# Patient Record
Sex: Female | Born: 1995
Health system: Southern US, Community
[De-identification: ages and names within clinical notes are randomized; demographics above are authoritative.]

## PROBLEM LIST (undated history)

## (undated) DIAGNOSIS — J3089 Other allergic rhinitis: Secondary | ICD-10-CM

## (undated) DIAGNOSIS — J45909 Unspecified asthma, uncomplicated: Secondary | ICD-10-CM

## (undated) DIAGNOSIS — G43909 Migraine, unspecified, not intractable, without status migrainosus: Secondary | ICD-10-CM

## (undated) HISTORY — DX: Other allergic rhinitis: J30.89

## (undated) HISTORY — PX: NECK MASS EXCISION: SHX2079

## (undated) HISTORY — DX: Migraine, unspecified, not intractable, without status migrainosus: G43.909

---
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
## 2019-02-25 ENCOUNTER — Other Ambulatory Visit: Payer: Self-pay

## 2019-02-25 ENCOUNTER — Emergency Department
Admission: EM | Admit: 2019-02-25 | Discharge: 2019-02-25 | Disposition: A | Discharge status: Home / Self Care | Payer: Self-pay | Attending: Emergency Medicine | Admitting: Emergency Medicine

## 2019-02-25 DIAGNOSIS — J45909 Unspecified asthma, uncomplicated: Secondary | ICD-10-CM | POA: Insufficient documentation

## 2019-02-25 DIAGNOSIS — L03011 Cellulitis of right finger: Secondary | ICD-10-CM | POA: Insufficient documentation

## 2019-02-25 DIAGNOSIS — M79644 Pain in right finger(s): Secondary | ICD-10-CM | POA: Insufficient documentation

## 2019-02-25 HISTORY — DX: Unspecified asthma, uncomplicated: J45.909

## 2019-02-25 MED ORDER — CEPHALEXIN 500 MG PO CAPS: 500.0000 mg | ORAL_CAPSULE | Freq: Three times a day (TID) | ORAL | 0 refills | Status: DC

## 2019-02-25 MED ORDER — CEPHALEXIN 500 MG PO CAPS
500.0000 mg | ORAL_CAPSULE | Freq: Once | ORAL | Status: AC
  Administered 2019-02-25: 500 mg via ORAL
  Filled 2019-02-25: qty 1

## 2019-02-25 NOTE — ED Provider Notes (Signed)
[facility] Emergency Department Provider Note ____________________________________________  Time seen: [id number]  I have reviewed the triage vital signs and the nursing notes.  HISTORY  Chief Complaint  Hand Pain  HPI Courtney Galvan is a 23 y.o. female presents herself to the ED with a 3-day complaint of swelling and pain to the fingertip of the right middle finger.  Patient denies any known injury, trauma, or accident.  She does admit to trimming her cuticles weekly, and noted initially pain to the lateral aspect of the cuticle on the right middle finger.  She denied any hangnail, bleeding, or purulent drainage.  Since that time she has had pain that seemed to progress from the medial aspect of the cuticle to the fat pad at the fingertip.  She presents today with tightness to the fingertip but reports normal range and sensation.  She denies any interim fevers, chills, or sweats.   Past Medical History:  Diagnosis Date  . Asthma     There are no active problems to display for this patient.   History reviewed. No pertinent surgical history.  Prior to Admission medications   Medication Sig Start Date End Date Taking? Authorizing Provider  cephALEXin (KEFLEX) 500 MG capsule Take 1 capsule (500 mg total) by mouth 3 (three) times daily. 02/25/19   Orin Eberwein, Dannielle Karvonen, PA-C    Allergies Patient has no known allergies.  No family history on file.  Social History Social History   Tobacco Use  . Smoking status: Never Smoker  . Smokeless tobacco: Never Used  Substance Use Topics  . Alcohol use: Not Currently  . Drug use: Not Currently    Review of Systems  Constitutional: Negative for fever. Cardiovascular: Negative for chest pain. Respiratory: Negative for shortness of breath. Musculoskeletal: Negative for back pain. Right middle finger pain  Skin: Negative for rash. Neurological: Negative for headaches, focal weakness or  numbness. ____________________________________________  PHYSICAL EXAM:  VITAL SIGNS: ED Triage Vitals  Enc Vitals Group     BP 02/25/19 1359 118/70     Pulse Rate 02/25/19 1359 64     Resp 02/25/19 1359 16     Temp 02/25/19 1359 98.2 F (36.8 C)     Temp Source 02/25/19 1359 Oral     SpO2 02/25/19 1359 98 %     Weight 02/25/19 1359 280 lb (127 kg)     Height 02/25/19 1359 5\' 7"  (1.702 m)     Head Circumference --      Peak Flow --      Pain Score 02/25/19 1356 6     Pain Loc --      Pain Edu? --      Excl. in [place]? --     Constitutional: Alert and oriented. Well appearing and in no distress. Head: Normocephalic and atraumatic. Eyes: Conjunctivae are normal. Normal extraocular movements Cardiovascular: Normal rate, regular rhythm. Normal distal pulses. Respiratory: Normal respiratory effort. No wheezes/rales/rhonchi. Gastrointestinal: Soft and nontender. No distention. Musculoskeletal: Right middle finger with subtle swelling to the medial nail cuticle. No obvious, gross purulent collection noted. There is tenderness to palpation to the lateral cuticle as well as some firmness to the fat pad.  Hepatospleno without any obvious pus collection noted.  Nontender with normal range of motion in all extremities.  Neurologic:  Normal gait without ataxia. Normal speech and language. No gross focal neurologic deficits are appreciated. Skin:  Skin is warm, dry and intact. No rash noted. ____________________________________________  PROCEDURES  Keflex 500 mg PO Procedures ____________________________________________  INITIAL IMPRESSION / ASSESSMENT AND PLAN / ED COURSE  Patient with ED evaluation of paronychia to the right middle finger.  She has some extension of tenderness and swelling to the finger pad at this time but no significant felon is appreciated.  Patient will be started empirically on antibiotics and encouraged to monitor closely for increased pain and swelling patient  return to the ED immediately for further management including I&D procedure.  Courtney Galvan was evaluated in Emergency Department on 02/25/2019 for the symptoms described in the history of present illness. She was evaluated in the context of the global COVID-19 pandemic, which necessitated consideration that the patient might be at risk for infection with the SARS-CoV-2 virus that causes COVID-19. Institutional protocols and algorithms that pertain to the evaluation of patients at risk for COVID-19 are in a state of rapid change based on information released by regulatory bodies including the CDC and federal and state organizations. These policies and algorithms were followed during the patient's care in the ED. ____________________________________________  FINAL CLINICAL IMPRESSION(S) / ED DIAGNOSES  Final diagnoses:  Paronychia of right middle finger      Marx Doig, Charlesetta Ivory, PA-C 02/25/19 1701    Chesley Noon, MD 02/25/19 1810

## 2019-02-25 NOTE — Discharge Instructions (Signed)
Your exam is consistent with a cuticle infection. Take the antibiotic as directed. Soak the finger in warm epsom salt soaks to promote healing. You should return to the ED for any drainable infection.

## 2019-02-25 NOTE — ED Triage Notes (Signed)
Pt c/o pain and swelling to the right middle finger for the past 3 days, denies injury

## 2019-03-04 ENCOUNTER — Encounter: Payer: Self-pay | Admitting: Emergency Medicine

## 2019-03-04 ENCOUNTER — Emergency Department
Admission: EM | Admit: 2019-03-04 | Discharge: 2019-03-04 | Disposition: A | Discharge status: Home / Self Care | Payer: Medicaid Other | Attending: Emergency Medicine | Admitting: Emergency Medicine

## 2019-03-04 ENCOUNTER — Other Ambulatory Visit: Payer: Self-pay

## 2019-03-04 DIAGNOSIS — L03011 Cellulitis of right finger: Secondary | ICD-10-CM

## 2019-03-04 DIAGNOSIS — J45909 Unspecified asthma, uncomplicated: Secondary | ICD-10-CM | POA: Insufficient documentation

## 2019-03-04 MED ORDER — CEPHALEXIN 500 MG PO CAPS: 500.0000 mg | ORAL_CAPSULE | Freq: Three times a day (TID) | ORAL | 0 refills | Status: DC

## 2019-03-04 MED ORDER — HYDROCODONE-ACETAMINOPHEN 5-325 MG PO TABS: 1.0000 | ORAL_TABLET | Freq: Three times a day (TID) | ORAL | 0 refills | Status: DC | PRN

## 2019-03-04 MED ORDER — VANCOMYCIN HCL IN DEXTROSE 1-5 GM/200ML-% IV SOLN
1000.0000 mg | Freq: Once | INTRAVENOUS | Status: AC
  Administered 2019-03-04: 1000 mg via INTRAVENOUS
  Filled 2019-03-04: qty 200

## 2019-03-04 MED ORDER — SULFAMETHOXAZOLE-TRIMETHOPRIM 800-160 MG PO TABS: 1.0000 | ORAL_TABLET | Freq: Two times a day (BID) | ORAL | 0 refills | Status: DC

## 2019-03-04 MED ORDER — LIDOCAINE HCL (PF) 1 % IJ SOLN
10.0000 mL | Freq: Once | INTRAMUSCULAR | Status: AC
  Administered 2019-03-04: 10 mL
  Filled 2019-03-04: qty 10

## 2019-03-04 NOTE — Discharge Instructions (Signed)
Return to the emergency department for packing removal and also for reevaluation of your finger.  Begin taking 2 antibiotics.  A prescription for the same medication that you are taking and a another medication combined should finish the course of your antibiotics.  If your finger worsens return to the emergency department before the 2 to 3 days.  A narcotic was prescribed also to be taken at night so that she can get some rest.

## 2019-03-04 NOTE — ED Provider Notes (Signed)
[facility] Emergency Department Provider Note   ____________________________________________   First MD Initiated Contact with Patient 03/04/19 [phone]     (approximate)  I have reviewed the triage vital signs and the nursing notes.   HISTORY  Chief Complaint Hand Pain   HPI Courtney Galvan is a 23 y.o. female presents to the ED for follow-up of her right third finger.  Patient was seen on 02/25/2019 for a paronychia.  Patient has been taking Keflex and soaking her finger.  Patient states she does well during the day but if she bumps it during the night it wakes her up.  Currently she rates her pain as 4 out of 10.       Past Medical History:  Diagnosis Date  . Asthma     There are no active problems to display for this patient.   History reviewed. No pertinent surgical history.  Prior to Admission medications   Medication Sig Start Date End Date Taking? Authorizing Provider  cephALEXin (KEFLEX) 500 MG capsule Take 1 capsule (500 mg total) by mouth 3 (three) times daily. 03/04/19   Johnn Hai, PA-C  HYDROcodone-acetaminophen (NORCO/VICODIN) 5-325 MG tablet Take 1 tablet by mouth every 8 (eight) hours as needed for moderate pain. 03/04/19   Johnn Hai, PA-C  sulfamethoxazole-trimethoprim (BACTRIM DS) 800-160 MG tablet Take 1 tablet by mouth 2 (two) times daily. 03/04/19   Johnn Hai, PA-C    Allergies Patient has no known allergies.  No family history on file.  Social History Social History   Tobacco Use  . Smoking status: Never Smoker  . Smokeless tobacco: Never Used  Substance Use Topics  . Alcohol use: Not Currently  . Drug use: Not Currently    Review of Systems Constitutional: No fever/chills Cardiovascular: Denies chest pain. Respiratory: Denies shortness of breath. Gastrointestinal:  No nausea, no vomiting.  Musculoskeletal: Pain right third finger. Skin: Question infection right third finger  Neurological: Negative for headaches, focal weakness or numbness. ____________________________________________   PHYSICAL EXAM:  VITAL SIGNS: ED Triage Vitals  Enc Vitals Group     BP 03/04/19 0737 (!) 128/49     Pulse Rate 03/04/19 0737 71     Resp 03/04/19 0737 18     Temp 03/04/19 0737 99 F (37.2 C)     Temp Source 03/04/19 0737 Oral     SpO2 03/04/19 0737 99 %     Weight 03/04/19 0731 279 lb 15.8 oz (127 kg)     Height 03/04/19 0731 5\' 7"  (1.702 m)     Head Circumference --      Peak Flow --      Pain Score 03/04/19 0731 4     Pain Loc --      Pain Edu? --      Excl. in [place]? --     Constitutional: Alert and oriented. Well appearing and in no acute distress. Eyes: Conjunctivae are normal. Head: Atraumatic. Neck: No stridor.   Cardiovascular: Normal rate, regular rhythm. Grossly normal heart sounds.  Good peripheral circulation. Respiratory: Normal respiratory effort.  No retractions. Lungs CTAB. Musculoskeletal: Moves right digits with minimal restriction.  Patient is able to flex and extend her third digit distally.  Capillary refill is less than 3 seconds.  There is moderate tenderness with a fluctuant area on the lateral aspect at the base of the nail. Neurologic:  Normal speech and language. No gross focal neurologic deficits are appreciated. No gait instability. Skin:  Skin is warm, dry and intact.  Psychiatric: Mood and affect are normal. Speech and behavior are normal.  ____________________________________________   LABS (all labs ordered are listed, but only abnormal results are displayed)  Labs Reviewed - No data to display ____________________________________________  PROCEDURES  Procedure(s) performed (including Critical Care):  Marland KitchenMarland KitchenIncision and Drainage  Date/Time: 03/04/2019 8:30 AM Performed by: Tommi Rumps, PA-C Authorized by: Tommi Rumps, PA-C   Consent:    Consent obtained:  Verbal   Consent given by:  Patient   Risks discussed:   Pain Location:    Type:  Abscess   Location:  Upper extremity   Upper extremity location:  Finger   Finger location:  R long finger Pre-procedure details:    Skin preparation:  Antiseptic wash Anesthesia (see MAR for exact dosages):    Anesthesia method:  Nerve block   Block anesthetic:  Lidocaine 1% w/o epi   Block injection procedure:  Anatomic landmarks identified, introduced needle, incremental injection and negative aspiration for blood   Block outcome:  Anesthesia achieved Procedure type:    Complexity:  Simple Procedure details:    Needle aspiration: no     Incision types:  Single straight   Incision depth:  Dermal   Scalpel blade:  11   Wound management:  Probed and deloculated and irrigated with saline   Drainage:  Purulent   Drainage amount:  Moderate   Wound treatment:  Drain placed   Packing materials:  1/4 in iodoform gauze Post-procedure details:    Patient tolerance of procedure:  Tolerated well, no immediate complications     ____________________________________________   INITIAL IMPRESSION / ASSESSMENT AND PLAN / ED COURSE  As part of my medical decision making, I reviewed the following data within the electronic MEDICAL RECORD NUMBER Notes from prior ED visits and [place] Controlled Substance Database  23 year old female presents to the ED with complaint of right third digit pain.  Patient has been taking Keflex 500 mg and soaking her finger in warm water.  She denies any drainage from the area.  I&D was discussed with the patient and she tolerated it well.  There was a moderate amount of purulent drainage removed.  Iodoform gauze was placed to keep the area draining.  Patient also got vancomycin 1 g IV while in the ED.  Patient is to return to the ED in 2 days to have this removed.  She was discharged with a prescription for Bactrim DS twice daily for 10 days and continued Keflex.  Patient was made aware that she needs to return sooner if any worsening of her symptoms  and also if this is not improving that most likely she will be admitted.  ____________________________________________   FINAL CLINICAL IMPRESSION(S) / ED DIAGNOSES  Final diagnoses:  Paronychia of right middle finger     ED Discharge Orders         Ordered    cephALEXin (KEFLEX) 500 MG capsule  3 times daily     03/04/19 1055    sulfamethoxazole-trimethoprim (BACTRIM DS) 800-160 MG tablet  2 times daily     03/04/19 1055    HYDROcodone-acetaminophen (NORCO/VICODIN) 5-325 MG tablet  Every 8 hours PRN     03/04/19 1055           Note:  This document was prepared using Dragon voice recognition software and may include unintentional dictation errors.    Tommi Rumps, PA-C 03/04/19 1442    Sharman Cheek, MD 03/04/19 [phone]

## 2019-03-04 NOTE — ED Notes (Signed)
Pt presents to ED via POV, states was seen approx 1 week ago and dx with infection to finger nail, R middle finger appears swollen and had "black spot" that she is concerned about. Pt states has been taking abx as prescribed.

## 2019-03-04 NOTE — ED Notes (Signed)
Pt finger wrapped in gauze and tape.

## 2019-03-04 NOTE — ED Triage Notes (Signed)
Has taken the cephalexin for finger, not better.  Still looks infected next to nail

## 2019-03-06 ENCOUNTER — Other Ambulatory Visit: Payer: Self-pay

## 2019-03-06 ENCOUNTER — Emergency Department
Admission: EM | Admit: 2019-03-06 | Discharge: 2019-03-06 | Disposition: A | Discharge status: Home / Self Care | Payer: Medicaid Other | Attending: Emergency Medicine | Admitting: Emergency Medicine

## 2019-03-06 DIAGNOSIS — J45909 Unspecified asthma, uncomplicated: Secondary | ICD-10-CM | POA: Insufficient documentation

## 2019-03-06 DIAGNOSIS — Z4800 Encounter for change or removal of nonsurgical wound dressing: Secondary | ICD-10-CM | POA: Insufficient documentation

## 2019-03-06 DIAGNOSIS — Z5189 Encounter for other specified aftercare: Secondary | ICD-10-CM

## 2019-03-06 DIAGNOSIS — L03011 Cellulitis of right finger: Secondary | ICD-10-CM | POA: Insufficient documentation

## 2019-03-06 NOTE — ED Provider Notes (Signed)
[facility] Emergency Department Provider Note   ____________________________________________   None    (approximate)  I have reviewed the triage vital signs and the nursing notes.   HISTORY  Chief Complaint Wound Check    HPI Courtney Galvan is a 23 y.o. female patient presents for wound check secondary to I&D of the right middle finger 2 days ago.  Patient voices no complaints.  Patient is currently taking Keflex and Bactrim DS.         Past Medical History:  Diagnosis Date  . Asthma     There are no active problems to display for this patient.   History reviewed. No pertinent surgical history.  Prior to Admission medications   Medication Sig Start Date End Date Taking? Authorizing Provider  cephALEXin (KEFLEX) 500 MG capsule Take 1 capsule (500 mg total) by mouth 3 (three) times daily. 03/04/19   Tommi Rumps, PA-C  HYDROcodone-acetaminophen (NORCO/VICODIN) 5-325 MG tablet Take 1 tablet by mouth every 8 (eight) hours as needed for moderate pain. 03/04/19   Tommi Rumps, PA-C  sulfamethoxazole-trimethoprim (BACTRIM DS) 800-160 MG tablet Take 1 tablet by mouth 2 (two) times daily. 03/04/19   Tommi Rumps, PA-C    Allergies Patient has no known allergies.  No family history on file.  Social History Social History   Tobacco Use  . Smoking status: Never Smoker  . Smokeless tobacco: Never Used  Substance Use Topics  . Alcohol use: Not Currently  . Drug use: Not Currently    Review of Systems Constitutional: No fever/chills Eyes: No visual changes. ENT: No sore throat. Cardiovascular: Denies chest pain. Respiratory: Denies shortness of breath. Gastrointestinal: No abdominal pain.  No nausea, no vomiting.  No diarrhea.  No constipation. Genitourinary: Negative for dysuria. Musculoskeletal: Negative for back pain. Skin: Negative for rash.  Incision right distal third finger. Neurological: Negative for  headaches, focal weakness or numbness.  ____________________________________________   PHYSICAL EXAM:  VITAL SIGNS: ED Triage Vitals  Enc Vitals Group     BP 03/06/19 1021 132/85     Pulse Rate 03/06/19 1021 96     Resp 03/06/19 1021 17     Temp 03/06/19 1021 98.4 F (36.9 C)     Temp Source 03/06/19 1021 Oral     SpO2 03/06/19 1021 98 %     Weight 03/06/19 1023 250 lb (113.4 kg)     Height 03/06/19 1023 5\' 6"  (1.676 m)     Head Circumference --      Peak Flow --      Pain Score 03/06/19 1023 0     Pain Loc --      Pain Edu? --      Excl. in [place]? --    Constitutional: Alert and oriented. Well appearing and in no acute distress. Cardiovascular: Normal rate, regular rhythm. Grossly normal heart sounds.  Good peripheral circulation. Respiratory: Normal respiratory effort.  No retractions. Lungs CTAB. Musculoskeletal: Full equal range of motion of the third digit right hand.   Neurologic:  Normal speech and language. No gross focal neurologic deficits are appreciated. No gait instability. Skin: Incision site at the distal third digit right hand.   Psychiatric: Mood and affect are normal. Speech and behavior are normal.  ____________________________________________   LABS (all labs ordered are listed, but only abnormal results are displayed)  Labs Reviewed - No data to display ____________________________________________  EKG   ____________________________________________  RADIOLOGY  ED MD interpretation:  Official radiology report(s): No results found.  ____________________________________________   PROCEDURES  Procedure(s) performed (including Critical Care):  Procedures   ____________________________________________   INITIAL IMPRESSION / ASSESSMENT AND PLAN / ED COURSE  As part of my medical decision making, I reviewed the following data within the [place]         Patient presents for wound size secondary to paronychia  which was I&D 2 days ago.  Physical exam revealed decreased edema and no drainage.  Patient given discharge care instructions.  Patient advised continue previous medication.  Patient advised to follow-up if condition worsens.      ____________________________________________   FINAL CLINICAL IMPRESSION(S) / ED DIAGNOSES  Final diagnoses:  Encounter for wound re-check     ED Discharge Orders    None       Note:  This document was prepared using Dragon voice recognition software and may include unintentional dictation errors.    Sable Feil, PA-C 03/06/19 1047    Earleen Newport, MD 03/06/19 [phone]

## 2019-03-06 NOTE — Discharge Instructions (Addendum)
Follow discharge care instruction and continue previous medications. 

## 2019-03-06 NOTE — ED Triage Notes (Signed)
Pt is here for a wound check of the right middle finger

## 2019-07-23 ENCOUNTER — Other Ambulatory Visit: Payer: Self-pay

## 2019-07-23 ENCOUNTER — Encounter: Payer: Self-pay | Admitting: Physician Assistant

## 2019-07-23 ENCOUNTER — Ambulatory Visit: Payer: Self-pay | Admitting: Physician Assistant

## 2019-07-23 DIAGNOSIS — Z202 Contact with and (suspected) exposure to infections with a predominantly sexual mode of transmission: Secondary | ICD-10-CM

## 2019-07-23 DIAGNOSIS — Z113 Encounter for screening for infections with a predominantly sexual mode of transmission: Secondary | ICD-10-CM

## 2019-07-23 DIAGNOSIS — Z3009 Encounter for other general counseling and advice on contraception: Secondary | ICD-10-CM

## 2019-07-23 LAB — WET PREP FOR TRICH, YEAST, CLUE
Trichomonas Exam: NEGATIVE
Yeast Exam: NEGATIVE

## 2019-07-23 MED ORDER — METRONIDAZOLE 500 MG PO TABS: 2000.0000 mg | ORAL_TABLET | Freq: Once | ORAL | 0 refills | Status: AC

## 2019-07-23 MED ORDER — AZITHROMYCIN 500 MG PO TABS
1000.0000 mg | ORAL_TABLET | Freq: Once | ORAL | Status: AC
  Administered 2019-07-23: 1000 mg via ORAL

## 2019-07-23 NOTE — Progress Notes (Signed)
Wet mount reviewed, pt treated per provider orders. Provider orders completed. 

## 2019-07-24 ENCOUNTER — Encounter: Payer: Self-pay | Admitting: Physician Assistant

## 2019-07-24 NOTE — Progress Notes (Signed)
[facility] Department STI clinic/screening visit  Subjective:  Courtney Galvan is a 24 y.o. female being seen today for an STI screening visit. The patient reports they do not have symptoms.  Patient reports that they do not desire a pregnancy in the next year.   They reported they are not interested in discussing contraception today.  No LMP recorded.   Patient has the following medical conditions:  There are no problems to display for this patient.   Chief Complaint  Patient presents with  . SEXUALLY TRANSMITTED DISEASE    STD screening (no bloodwork)    HPI  Patient reports that she is not having any symptoms but would like a screening today since she was told by her new partner that he was treated for Chlamydia and Trich.  LMP 06/02/2019 and was normal, but did not have a period in February.  States last sex was 1 month ago and last pregnancy test at home was yesterday and was negative.  Is interested in information about BCM.  See flowsheet for further details and programmatic requirements.    The following portions of the patient's history were reviewed and updated as appropriate: allergies, current medications, past medical history, past social history, past surgical history and problem list.  Objective:  There were no vitals filed for this visit.  Physical Exam Constitutional:      General: She is not in acute distress.    Appearance: Normal appearance.  HENT:     Head: Normocephalic and atraumatic.     Comments: No nits, lice or hair loss. No cervical, supraclavicular or axillary adenopathy.    Mouth/Throat:     Mouth: Mucous membranes are moist.     Pharynx: Oropharynx is clear. No oropharyngeal exudate or posterior oropharyngeal erythema.  Eyes:     Conjunctiva/sclera: Conjunctivae normal.  Pulmonary:     Effort: Pulmonary effort is normal.  Abdominal:     Palpations: Abdomen is soft. There is no mass.     Tenderness: There is no abdominal  tenderness. There is no guarding or rebound.  Genitourinary:    General: Normal vulva.     Rectum: Normal.     Comments: External genitalia/pubic area without nits, lice, edema, erythema, lesions and inguinal adenopathy. Vagina with normal mucosa and small amount of white discharge, pH=4.5. Cervix without visible lesions. Uterus firm, mobile, nt, no masses, no CMT, no adnexal tenderness or fullness. Musculoskeletal:     Cervical back: Neck supple. No tenderness.  Skin:    General: Skin is warm and dry.     Findings: No bruising, erythema, lesion or rash.  Neurological:     Mental Status: She is alert and oriented to person, place, and time.  Psychiatric:        Mood and Affect: Mood normal.        Behavior: Behavior normal.        Thought Content: Thought content normal.        Judgment: Judgment normal.      Assessment and Plan:  Courtney Galvan is a 24 y.o. female presenting to the [facility] Department for STI screening  1. Screening for STD (sexually transmitted disease) Patient into clinic without symptoms but is a contact to Chlamydia and Trich. Rec condoms with all sex. Await test results.  Counseled that RN will call if needs to RTC for further treatment once results are back.  - WET PREP FOR [place]  [facility] - HIV [place] [facility] - Syphilis Serology, [place] [facility]  2. Venereal disease contact Will treat as a contact to Chlamydia and Trich with Azithromycin 1g po DOT today and Metronidazole 2 g po with food, no EtOH for 24 hr before and until 72 hr after completing medicine. No sex for 7 days and until after partner completes treatment. Rec using OTC antifungal cream if has itching after using antibiotics. RTC for re-treatment if vomits < 2 hr after taking medicine. - azithromycin (ZITHROMAX) tablet 1,000 mg - metroNIDAZOLE (FLAGYL) 500 MG tablet; Take 4 tablets (2,000 mg total) by mouth once for  1 dose.  Dispense: 4 tablet; Refill: 0  3.  Counseling about contraception Brief counseling about hormonal BCM today. Birth Control Method pamphlet given to patient to review and to call for [facility] appointment once has decided if desires to start a hormonal BCM. Counseled patient that skipped periods can occasionally happen and sometimes happen due to added stressors or infections.  Counseled that likely not pregnant due to last sex being 1 month ago and pregnancy test yesterday was negative per her report but that she should definitely follow up with PCP or [facility] if does not have a period in 4-6 months and can be pretty sure she is not pregnant.  No follow-ups on file.  No future appointments.  Matt Holmes, PA

## 2019-07-29 LAB — GONOCOCCUS CULTURE

## 2019-08-03 ENCOUNTER — Encounter: Payer: Self-pay | Admitting: Physician Assistant

## 2019-08-19 ENCOUNTER — Telehealth: Payer: Self-pay

## 2019-08-19 NOTE — Addendum Note (Signed)
Addended by: Richmond Campbell on: 08/19/2019 01:17 PM   Modules accepted: Orders

## 2019-08-25 NOTE — Telephone Encounter (Signed)
Tc from patient.  Patient did not have blood work at last visit.  Orders cancelled. Richmond Campbell, RN

## 2019-10-02 ENCOUNTER — Ambulatory Visit: Payer: Medicaid Other | Admitting: Neurology

## 2019-10-20 ENCOUNTER — Emergency Department: Payer: Medicaid Other

## 2019-10-20 ENCOUNTER — Other Ambulatory Visit: Payer: Self-pay

## 2019-10-20 ENCOUNTER — Emergency Department
Admission: EM | Admit: 2019-10-20 | Discharge: 2019-10-20 | Disposition: A | Discharge status: Home / Self Care | Payer: Medicaid Other | Attending: Emergency Medicine | Admitting: Emergency Medicine

## 2019-10-20 DIAGNOSIS — J45909 Unspecified asthma, uncomplicated: Secondary | ICD-10-CM | POA: Insufficient documentation

## 2019-10-20 DIAGNOSIS — R1013 Epigastric pain: Secondary | ICD-10-CM | POA: Insufficient documentation

## 2019-10-20 DIAGNOSIS — R0789 Other chest pain: Secondary | ICD-10-CM

## 2019-10-20 LAB — CBC
HCT: 35.2 % — ABNORMAL LOW (ref 36.0–46.0)
Hemoglobin: 11.6 g/dL — ABNORMAL LOW (ref 12.0–15.0)
MCH: 30.5 pg (ref 26.0–34.0)
MCHC: 33 g/dL (ref 30.0–36.0)
MCV: 92.6 fL (ref 80.0–100.0)
Platelets: 301 10*3/uL (ref 150–400)
RBC: 3.8 MIL/uL — ABNORMAL LOW (ref 3.87–5.11)
RDW: 11.6 % (ref 11.5–15.5)
WBC: 7.1 10*3/uL (ref 4.0–10.5)
nRBC: 0 % (ref 0.0–0.2)

## 2019-10-20 LAB — BASIC METABOLIC PANEL
Anion gap: 10 (ref 5–15)
BUN: 9 mg/dL (ref 6–20)
CO2: 27 mmol/L (ref 22–32)
Calcium: 9.2 mg/dL (ref 8.9–10.3)
Chloride: 100 mmol/L (ref 98–111)
Creatinine, Ser: 0.59 mg/dL (ref 0.44–1.00)
GFR calc Af Amer: >60 mL/min (ref >60)
GFR calc non Af Amer: >60 mL/min (ref >60)
Glucose, Bld: 84 mg/dL (ref 70–99)
Potassium: 3.9 mmol/L (ref 3.5–5.1)
Sodium: 137 mmol/L (ref 135–145)

## 2019-10-20 LAB — TROPONIN I (HIGH SENSITIVITY): Troponin I (High Sensitivity): <2 ng/L (ref <18)

## 2019-10-20 MED ORDER — LIDOCAINE VISCOUS HCL 2 % MT SOLN
15.0000 mL | Freq: Once | OROMUCOSAL | Status: AC
  Administered 2019-10-20: 15 mL via ORAL
  Filled 2019-10-20: qty 15

## 2019-10-20 MED ORDER — ALUM & MAG HYDROXIDE-SIMETH 200-200-20 MG/5ML PO SUSP
30.0000 mL | Freq: Once | ORAL | Status: AC
  Administered 2019-10-20: 30 mL via ORAL
  Filled 2019-10-20: qty 30

## 2019-10-20 MED ORDER — SODIUM CHLORIDE 0.9% FLUSH: 3.0000 mL | Freq: Once | INTRAVENOUS | Status: DC

## 2019-10-20 NOTE — ED Notes (Signed)
AAOx3.  Skin warm and dry.  NAD 

## 2019-10-20 NOTE — ED Triage Notes (Signed)
Pt arrives via POV for reports of central chest pain and inability to take a deep breath. Pt reports hx of asthma but states this "does not feel like asthma". Pt ambulatory from triage with steady gait, NAD. Pt feels like she swallowed something and it is stuck.

## 2019-10-20 NOTE — Discharge Instructions (Signed)
For your chest discomfort/pressure:  Take Famotidine (Pepcid) twice a day for the next 3 days, then once daily as needed  Take TUMS or ROLAIDS throughout the day  Avoid spicy foods or foods high in acid  Avoid fried foods

## 2019-10-20 NOTE — ED Provider Notes (Signed)
[facility] Emergency Department Provider Note  ____________________________________________   First MD Initiated Contact with Patient 10/20/19 1649     (approximate)  I have reviewed the triage vital signs and the nursing notes.   HISTORY  Chief Complaint Chest Pain    HPI Courtney Galvan is a 24 y.o. female here with epigastric/substernal chest pain.  The patient states that over the last day, she has had fairly constant, gradual onset, aching pressure-like sensation in her substernal and epigastric area.  She has had associated feeling like she cannot catch a full breath.  This pain has been fairly constant.  No specific alleviating or aggravating factors.  Specifically, no exacerbation with exertion.  No overt right upper quadrant tenderness.  No history of similar symptoms.  No suspicious food intake.  No vomiting.        Past Medical History:  Diagnosis Date  . Asthma     There are no problems to display for this patient.   History reviewed. No pertinent surgical history.  Prior to Admission medications   Medication Sig Start Date End Date Taking? Authorizing Provider  cephALEXin (KEFLEX) 500 MG capsule Take 1 capsule (500 mg total) by mouth 3 (three) times daily. Patient not taking: Reported on 07/23/2019 03/04/19   Johnn Hai, PA-C  HYDROcodone-acetaminophen (NORCO/VICODIN) 5-325 MG tablet Take 1 tablet by mouth every 8 (eight) hours as needed for moderate pain. Patient not taking: Reported on 07/23/2019 03/04/19   Johnn Hai, PA-C  sulfamethoxazole-trimethoprim (BACTRIM DS) 800-160 MG tablet Take 1 tablet by mouth 2 (two) times daily. Patient not taking: Reported on 07/23/2019 03/04/19   Johnn Hai, PA-C    Allergies Patient has no known allergies.  History reviewed. No pertinent family history.  Social History Social History   Tobacco Use  . Smoking status: Never Smoker  . Smokeless tobacco: Never Used    Substance Use Topics  . Alcohol use: Not Currently  . Drug use: Not Currently    Review of Systems  Review of Systems  Constitutional: Negative for fever.  HENT: Negative for congestion and sore throat.   Eyes: Negative for visual disturbance.  Respiratory: Positive for chest tightness. Negative for cough and shortness of breath.   Cardiovascular: Positive for chest pain.  Gastrointestinal: Negative for abdominal pain, diarrhea, nausea and vomiting.  Genitourinary: Negative for flank pain.  Musculoskeletal: Negative for back pain and neck pain.  Skin: Negative for rash and wound.  Neurological: Negative for weakness.     ____________________________________________  PHYSICAL EXAM:      VITAL SIGNS: ED Triage Vitals  Enc Vitals Group     BP 10/20/19 1401 (!) 128/93     Pulse Rate 10/20/19 1401 66     Resp 10/20/19 1401 20     Temp 10/20/19 1401 98.4 F (36.9 C)     Temp Source 10/20/19 1401 Oral     SpO2 10/20/19 1401 100 %     Weight 10/20/19 1402 250 lb (113.4 kg)     Height 10/20/19 1402 5\' 6"  (1.676 m)     Head Circumference --      Peak Flow --      Pain Score 10/20/19 1402 4     Pain Loc --      Pain Edu? --      Excl. in [place]? --      Physical Exam Vitals and nursing note reviewed.  Constitutional:      General: She is  not in acute distress.    Appearance: She is well-developed.  HENT:     Head: Normocephalic and atraumatic.  Eyes:     Conjunctiva/sclera: Conjunctivae normal.  Cardiovascular:     Rate and Rhythm: Normal rate and regular rhythm.     Heart sounds: Normal heart sounds. No murmur heard.  No friction rub.  Pulmonary:     Effort: Pulmonary effort is normal. No respiratory distress.     Breath sounds: Normal breath sounds. No wheezing or rales.  Abdominal:     General: There is no distension.     Palpations: Abdomen is soft.     Tenderness: There is no abdominal tenderness.  Musculoskeletal:     Cervical back: Neck supple.  Skin:     General: Skin is warm.     Capillary Refill: Capillary refill takes less than 2 seconds.     Findings: No rash.  Neurological:     Mental Status: She is alert and oriented to person, place, and time.     Motor: No abnormal muscle tone.       ____________________________________________   LABS (all labs ordered are listed, but only abnormal results are displayed)  Labs Reviewed  CBC - Abnormal; Notable for the following components:      Result Value   RBC 3.80 (*)    Hemoglobin 11.6 (*)    HCT 35.2 (*)    All other components within normal limits  BASIC METABOLIC PANEL  POC URINE PREG, ED  TROPONIN I (HIGH SENSITIVITY)    ____________________________________________  EKG: Normal sinus rhythm, VR 61. PR 172, QRS 86, QTc 384. No acute ST elevation or depression. No ischemia or infarct. ________________________________________  RADIOLOGY All imaging, including plain films, CT scans, and ultrasounds, independently reviewed by me, and interpretations confirmed via formal radiology reads.  ED MD interpretation:   CXR: No acute abnormality  Official radiology report(s): DG Chest 2 View  Result Date: 10/20/2019 CLINICAL DATA:  Onset midsternal chest pain yesterday. No known injury. EXAM: CHEST - 2 VIEW COMPARISON:  None. FINDINGS: Lungs clear. Heart size normal. No pneumothorax or pleural fluid. No bony abnormality. IMPRESSION: Negative chest. Electronically Signed   By: Drusilla Kanner M.D.   On: 10/20/2019 14:42    ____________________________________________  PROCEDURES   Procedure(s) performed (including Critical Care):  Procedures  ____________________________________________  INITIAL IMPRESSION / MDM / ASSESSMENT AND PLAN / ED COURSE  As part of my medical decision making, I reviewed the following data within the electronic MEDICAL RECORD NUMBER Nursing notes reviewed and incorporated, Old chart reviewed, Notes from prior ED visits, and [place] Controlled Substance  Database       *Courtney Galvan was evaluated in Emergency Department on 10/20/2019 for the symptoms described in the history of present illness. She was evaluated in the context of the global COVID-19 pandemic, which necessitated consideration that the patient might be at risk for infection with the SARS-CoV-2 virus that causes COVID-19. Institutional protocols and algorithms that pertain to the evaluation of patients at risk for COVID-19 are in a state of rapid change based on information released by regulatory bodies including the CDC and federal and state organizations. These policies and algorithms were followed during the patient's care in the ED.  Some ED evaluations and interventions may be delayed as a result of limited staffing during the pandemic.*     Medical Decision Making:  24 yo F here with atypical chest pain/pressure. Improved with GI cocktail. DDx includes GERD, gastritis,  less likely symptomatic palpitations. No arrhythmia here. EKG is nonischemic and normal intervals. Trop neg despite sx for hours, doubt ACS. No tachycardia, tachypnea, or s/s PE or DVT. Her abdomen is soft without TTP to suggest cholecystitis, pancreatitis, or other intra-abd pathology. No ectopy on monitoring. Will d/c with antacids, outpatient follow-up.  ____________________________________________  FINAL CLINICAL IMPRESSION(S) / ED DIAGNOSES  Final diagnoses:  Atypical chest pain     MEDICATIONS GIVEN DURING THIS VISIT:  Medications  sodium chloride flush (NS) 0.9 % injection 3 mL (has no administration in time range)  alum & mag hydroxide-simeth (MAALOX/MYLANTA) 200-200-20 MG/5ML suspension 30 mL (30 mLs Oral Given 10/20/19 1735)    And  lidocaine (XYLOCAINE) 2 % viscous mouth solution 15 mL (15 mLs Oral Given 10/20/19 1735)     ED Discharge Orders    None       Note:  This document was prepared using Dragon voice recognition software and may include unintentional dictation errors.     Shaune Pollack, MD 10/20/19 [phone]

## 2019-12-17 ENCOUNTER — Ambulatory Visit: Payer: Medicaid Other

## 2019-12-21 ENCOUNTER — Ambulatory Visit: Payer: Medicaid Other

## 2019-12-21 ENCOUNTER — Other Ambulatory Visit: Payer: Self-pay

## 2019-12-21 ENCOUNTER — Ambulatory Visit: Payer: Self-pay | Admitting: Advanced Practice Midwife

## 2019-12-21 DIAGNOSIS — Z113 Encounter for screening for infections with a predominantly sexual mode of transmission: Secondary | ICD-10-CM

## 2019-12-21 LAB — WET PREP FOR TRICH, YEAST, CLUE
Trichomonas Exam: NEGATIVE
Yeast Exam: NEGATIVE

## 2019-12-21 LAB — PREGNANCY, URINE: Preg Test, Ur: NEGATIVE

## 2019-12-21 NOTE — Progress Notes (Signed)
[facility] Department STI clinic/screening visit  Subjective:  Courtney Galvan is a 24 y.o. SBF nonsmoker nullip female being seen today for an STI screening visit. The patient reports they do not have symptoms.  Patient reports that they do not desire a pregnancy in the next year.   They reported they are not interested in discussing contraception today.  Patient's last menstrual period was 11/30/2019 (approximate).   Patient has the following medical conditions:   Patient Active Problem List   Diagnosis Date Noted  . Morbid obesity (HCC) 250 lbs 12/21/2019    Chief Complaint  Patient presents with  . SEXUALLY TRANSMITTED DISEASE    screening     HPI  Patient reports LMP 11/30/19.  Last sex 12/12/19 without condom; 4 sex partners in last 3 mo.  Last ETOH 12/19/19 (1 mixed drink) 3-4x/mo.  Last HIV test per patient/review of record was never Patient reports last pap was never  See flowsheet for further details and programmatic requirements.    The following portions of the patient's history were reviewed and updated as appropriate: allergies, current medications, past medical history, past social history, past surgical history and problem list.  Objective:  There were no vitals filed for this visit.  Physical Exam Vitals and nursing note reviewed.  Constitutional:      Appearance: Normal appearance. She is obese.  HENT:     Head: Normocephalic and atraumatic.     Mouth/Throat:     Mouth: Mucous membranes are moist.     Pharynx: Oropharynx is clear. No oropharyngeal exudate or posterior oropharyngeal erythema.  Eyes:     Conjunctiva/sclera: Conjunctivae normal.  Pulmonary:     Effort: Pulmonary effort is normal.  Abdominal:     Palpations: Abdomen is soft. There is no mass.     Tenderness: There is no abdominal tenderness. There is no rebound.     Comments: Increased adipose, poor tone, soft without tenderness  Genitourinary:    General: Normal vulva.      Exam position: Lithotomy position.     Pubic Area: No rash or pubic lice.      Labia:        Right: No rash or lesion.        Left: No rash or lesion.      Vagina: Vaginal discharge (thick white leukorrhea, ph<4.5) present. No erythema, bleeding or lesions.     Cervix: Normal.     Uterus: Normal.      Adnexa: Right adnexa normal and left adnexa normal.     Rectum: Normal.  Lymphadenopathy:     Head:     Right side of head: No preauricular or posterior auricular adenopathy.     Left side of head: No preauricular or posterior auricular adenopathy.     Cervical: No cervical adenopathy.     Upper Body:     Right upper body: No supraclavicular or axillary adenopathy.     Left upper body: No supraclavicular or axillary adenopathy.     Lower Body: No right inguinal adenopathy. No left inguinal adenopathy.  Skin:    General: Skin is warm and dry.     Findings: No rash.  Neurological:     Mental Status: She is alert and oriented to person, place, and time.      Assessment and Plan:  Courtney Galvan is a 24 y.o. female presenting to the [facility] Department for STI screening  1. Morbid obesity (HCC) 250 lbs  2. Screening examination for venereal disease Treat wet mount per standing orders Immunization nurse consult Pt interested in birth control but desires to schedule appt for such; will need pap and physical at that birth control appt as well.  Pt counseled not to have sex before birth control initiation - WET PREP FOR TRICH, YEAST, CLUE - Pregnancy, urine - Syphilis Serology, [facility] - Chlamydia/Gonorrhea [facility] - HIV [place] [facility] - Gonococcus culture     No follow-ups on file.  No future appointments.  Alberteen Spindle, CNM

## 2019-12-21 NOTE — Progress Notes (Signed)
Patient in clinic today for screening.  No NCIR for patient, [place].  STD consent signed.  Patient wants to discuss BCM toda. Wendi Snipes, RN  Post: Per wet mount, no treatment needed.  Patient PT is negative, patient informed of no sex or condom use until make appointment.  Condoms given.  Patient to call if questions or concerns.  Wendi Snipes, RN

## 2019-12-25 LAB — GONOCOCCUS CULTURE

## 2020-01-25 ENCOUNTER — Encounter: Payer: Self-pay | Admitting: Advanced Practice Midwife

## 2020-01-25 ENCOUNTER — Other Ambulatory Visit: Payer: Self-pay

## 2020-01-25 ENCOUNTER — Ambulatory Visit (LOCAL_COMMUNITY_HEALTH_CENTER): Payer: Medicaid Other | Admitting: Advanced Practice Midwife

## 2020-01-25 VITALS — BP 139/84 | Ht 66.0 in | Wt 243.0 lb

## 2020-01-25 DIAGNOSIS — Z30011 Encounter for initial prescription of contraceptive pills: Secondary | ICD-10-CM | POA: Diagnosis not present

## 2020-01-25 DIAGNOSIS — Z3009 Encounter for other general counseling and advice on contraception: Secondary | ICD-10-CM | POA: Diagnosis not present

## 2020-01-25 DIAGNOSIS — J45909 Unspecified asthma, uncomplicated: Secondary | ICD-10-CM | POA: Insufficient documentation

## 2020-01-25 LAB — WET PREP FOR TRICH, YEAST, CLUE
Trichomonas Exam: NEGATIVE
Yeast Exam: NEGATIVE

## 2020-01-25 MED ORDER — NORGESTIM-ETH ESTRAD TRIPHASIC 0.18/0.215/0.25 MG-25 MCG PO TABS: 1.0000 | ORAL_TABLET | Freq: Every day | ORAL | 13 refills | Status: DC

## 2020-01-25 NOTE — Progress Notes (Signed)
Pt is here for physical and to start birth control. Pt reports last sex was 12/27/2019 without condom. LMP 12/29/2019 per pt. Pt reports she is interested in the birth control pills.

## 2020-01-25 NOTE — Progress Notes (Signed)
Wet mount reviewed and is negative today, so no treatment needed for wet mount per standing order. Repeat BP 115/78 and E. Sciora, CNM made aware. Pt aware that birth control pills are e-prescribed to her pharmacy on file. RN counseling for birth control pills completed and pt states understanding. Counseled pt per provider orders and counseled pt that if she has any trouble getting her birth control pills or has any issues taking birth control pills to let [place] know and pt states understanding. Provider orders completed.

## 2020-01-25 NOTE — Progress Notes (Signed)
[facility] DEPARTMENT [facility] [address] Main Number: [phone]    Family Planning Visit- Initial Visit  Subjective:  Courtney Galvan is a 24 y.o. SBF nullip nonsmoker No obstetric history on file.   being seen today for an initial well woman visit and to discuss family planning options.  She is currently using None for pregnancy prevention. Patient reports she does not want a pregnancy in the next year.  Patient has the following medical conditions has Morbid obesity (HCC) 250 lbs; BMI=39.2 on their problem list.  Chief Complaint  Patient presents with  . Contraception    physical and birth control    Patient reports last sex 12/29/19 without condom.  LMP 12/31/19.  Last MJ 3 mo ago.  Nonsmoker.  Last ETOH 01/19/20 (1 [place] Iced Tea)qo months.  Drinks 2 c. Coffee/day and tea daily.  139/84, 115/78.  Employed 40 hrs/wk.  PT student at [employer]).  Living with mom and 67 yo brother.    Patient denies cigs, vaping, cigars  Body mass index is 39.22 kg/m. - Patient is eligible for diabetes screening based on BMI and age >38?  not applicable HA1C ordered? not applicable  Patient reports 5 of partners in last year. Desires STI screening?  Yes  Has patient been screened once for HCV in the past?  No  No results found for: HCVAB  Does the patient have current drug use (including MJ), have a partner with drug use, and/or has been incarcerated since last result? No  If yes-- Screen for HCV through [facility]   Does the patient meet criteria for HBV testing? Yes  Criteria:  -Household, sexual or needle sharing contact with HBV -History of drug use -HIV positive -Those with known Hep C   Health Maintenance Due  Topic Date Due  . Hepatitis C Screening  Never done  . COVID-19 Vaccine (1) Never done  . HIV Screening  Never done  . TETANUS/TDAP  Never done  . PAP-Cervical Cytology Screening  Never done  . PAP  SMEAR-Modifier  Never done  . INFLUENZA VACCINE  Never done    Review of Systems  All other systems reviewed and are negative.   The following portions of the patient's history were reviewed and updated as appropriate: allergies, current medications, past family history, past medical history, past social history, past surgical history and problem list. Problem list updated.   See flowsheet for other program required questions.  Objective:   Vitals:   01/25/20 1552  BP: 139/84  Weight: 243 lb (110.2 kg)  Height: 5\' 6"  (1.676 m)    Physical Exam Constitutional:      Appearance: Normal appearance. She is obese.  HENT:     Head: Normocephalic and atraumatic.     Mouth/Throat:     Mouth: Mucous membranes are moist.  Eyes:     Conjunctiva/sclera: Conjunctivae normal.  Cardiovascular:     Rate and Rhythm: Normal rate and regular rhythm.  Pulmonary:     Effort: Pulmonary effort is normal.     Breath sounds: Normal breath sounds.  Chest:     Breasts:        Right: Normal.        Left: Normal.  Abdominal:     Palpations: Abdomen is soft.     Comments: Soft without masses or tenderness, increased adipose  Genitourinary:    General: Normal vulva.     Exam position: Lithotomy position.  Vagina: No foreign body. Vaginal discharge (thick white leukorrhea, ph<4.5) present.     Cervix: Normal.     Uterus: Normal.      Adnexa: Right adnexa normal and left adnexa normal.     Rectum: Normal.     Comments: Pap done Musculoskeletal:        General: Normal range of motion.     Cervical back: Normal range of motion and neck supple.  Skin:    General: Skin is warm and dry.  Neurological:     Mental Status: She is alert.  Psychiatric:        Mood and Affect: Mood normal.       Assessment and Plan:  Courtney Galvan is a 24 y.o. female presenting to the [facility] Department for an initial well woman exam/family planning visit  Contraception counseling:  Reviewed all forms of birth control options in the tiered based approach. available including abstinence; over the counter/barrier methods; hormonal contraceptive medication including pill, patch, ring, injection,contraceptive implant, ECP; hormonal and nonhormonal IUDs; permanent sterilization options including vasectomy and the various tubal sterilization modalities. Risks, benefits, and typical effectiveness rates were reviewed.  Questions were answered.  Written information was also given to the patient to review.  Patient desires ocp's, this was prescribed for patient. She will follow up in 1 year for surveillance.  She was told to call with any further questions, or with any concerns about this method of contraception.  Emphasized use of condoms 100% of the time for STI prevention.  Patient was offered ECP. ECP was not accepted by the patient. ECP counseling was not given - see RN documentation  1. Family planning Treat wet mount per standing orders Immunization nurse consult Please repeat BP today=139/84 and 115/78 - WET PREP FOR TRICH, YEAST, CLUE - Chlamydia/Gonorrhea [place] [facility] - Pap IG (Image Guided)  2. Encounter for initial prescription of contraceptive pills Tri Lo Sprintec #13 I po daily to begin today Please counsel on need for abstinance/back up condoms next 7 days     No follow-ups on file.  No future appointments.  Alberteen Spindle, CNM

## 2020-01-27 LAB — PAP IG (IMAGE GUIDED): PAP Smear Comment: 0

## 2021-01-03 ENCOUNTER — Other Ambulatory Visit: Payer: Self-pay

## 2021-01-03 ENCOUNTER — Ambulatory Visit (INDEPENDENT_AMBULATORY_CARE_PROVIDER_SITE_OTHER): Payer: BC Managed Care – PPO | Admitting: Internal Medicine

## 2021-01-03 ENCOUNTER — Encounter: Payer: Self-pay | Admitting: Internal Medicine

## 2021-01-03 VITALS — BP 119/85 | HR 76 | Ht 66.0 in | Wt 247.6 lb

## 2021-01-03 DIAGNOSIS — G43C0 Periodic headache syndromes in child or adult, not intractable: Secondary | ICD-10-CM | POA: Insufficient documentation

## 2021-01-03 DIAGNOSIS — J3089 Other allergic rhinitis: Secondary | ICD-10-CM

## 2021-01-03 DIAGNOSIS — J45909 Unspecified asthma, uncomplicated: Secondary | ICD-10-CM | POA: Diagnosis not present

## 2021-01-03 NOTE — Assessment & Plan Note (Signed)
Stable at the present time. 

## 2021-01-03 NOTE — Progress Notes (Signed)
New Patient Office Visit  Subjective:  Patient ID: Courtney Galvan, female    DOB: 24-May-1995  Age: 25 y.o. MRN: [id number]  CC: No chief complaint on file.   Migraine  This is a recurrent problem. The current episode started more than 1 year ago. The problem occurs intermittently. The pain is located in the Frontal and left unilateral region. The pain does not radiate. The quality of the pain is described as aching. The pain is at a severity of 7/10. Pertinent negatives include no abdominal pain, anorexia, back pain, dizziness, eye pain, facial sweating, fever, hearing loss, insomnia, muscle aches, nausea, phonophobia, rhinorrhea, scalp tenderness, sore throat, vomiting or weakness.  Patient presents for headache  Past Medical History:  Diagnosis Date   Asthma    Environmental and seasonal allergies    Migraine      Current Outpatient Medications:    cephALEXin (KEFLEX) 500 MG capsule, Take 1 capsule (500 mg total) by mouth 3 (three) times daily. (Patient not taking: Reported on 07/23/2019), Disp: 21 capsule, Rfl: 0   HYDROcodone-acetaminophen (NORCO/VICODIN) 5-325 MG tablet, Take 1 tablet by mouth every 8 (eight) hours as needed for moderate pain. (Patient not taking: Reported on 07/23/2019), Disp: 15 tablet, Rfl: 0   ibuprofen (ADVIL) 200 MG tablet, Take 200 mg by mouth every 6 (six) hours as needed., Disp: , Rfl:    Norgestimate-Ethinyl Estradiol Triphasic (TRI-LO-SPRINTEC) 0.18/0.215/0.25 MG-25 MCG tab, Take 1 tablet by mouth daily., Disp: 28 tablet, Rfl: 13   sulfamethoxazole-trimethoprim (BACTRIM DS) 800-160 MG tablet, Take 1 tablet by mouth 2 (two) times daily. (Patient not taking: Reported on 07/23/2019), Disp: 20 tablet, Rfl: 0   Past Surgical History:  Procedure Laterality Date   NECK MASS EXCISION     Removal of mass 2007    Family History  Problem Relation Age of Onset   Hypertension Maternal Grandmother    Diabetes Maternal Grandmother    Brain cancer Maternal  Grandmother    Throat cancer Maternal Grandfather    Migraines Maternal Aunt     Social History   Socioeconomic History   Marital status: Single    Spouse name: Not on file   Number of children: Not on file   Years of education: Not on file   Highest education level: Not on file  Occupational History   Not on file  Tobacco Use   Smoking status: Never   Smokeless tobacco: Never  Substance and Sexual Activity   Alcohol use: Not Currently    Alcohol/week: 1.0 standard drink    Types: 1 Shots of liquor per week    Comment: qo month   Drug use: Not Currently    Types: Marijuana    Comment: last use 11/2019   Sexual activity: Yes    Partners: Male    Birth control/protection: Condom  Other Topics Concern   Not on file  Social History Narrative   Not on file   Social Determinants of Health   Financial Resource Strain: Not on file  Food Insecurity: Not on file  Transportation Needs: Not on file  Physical Activity: Not on file  Stress: Not on file  Social Connections: Not on file  Intimate Partner Violence: Not At Risk   Fear of Current or Ex-Partner: No   Emotionally Abused: No   Physically Abused: No   Sexually Abused: No    ROS Review of Systems  Constitutional:  Negative for fever.  HENT:  Negative for hearing loss, rhinorrhea and sore  throat.   Eyes:  Negative for pain.  Gastrointestinal:  Negative for abdominal pain, anorexia, nausea and vomiting.  Musculoskeletal:  Negative for back pain.  Neurological:  Negative for dizziness and weakness.  Psychiatric/Behavioral:  The patient does not have insomnia.    Objective:   Today's Vitals: BP 119/85   Pulse 76   Ht 5\' 6"  (1.676 m)   Wt 247 lb 9.6 oz (112.3 kg)   BMI 39.96 kg/m   Physical Exam Constitutional:      Appearance: Normal appearance. She is obese.  HENT:     Head: Normocephalic.     Nose: Nose normal.     Mouth/Throat:     Mouth: Mucous membranes are moist.  Eyes:     Extraocular Movements:  Extraocular movements intact.     Pupils: Pupils are equal, round, and reactive to light.  Cardiovascular:     Rate and Rhythm: Normal rate and regular rhythm.  Pulmonary:     Effort: Pulmonary effort is normal.  Abdominal:     General: Abdomen is flat.     Palpations: There is no mass.     Tenderness: There is no abdominal tenderness.  Musculoskeletal:        General: Normal range of motion.     Cervical back: Normal range of motion.  Skin:    General: Skin is warm.  Neurological:     General: No focal deficit present.     Mental Status: She is alert.  Psychiatric:        Behavior: Behavior normal.        Thought Content: Thought content normal.    Assessment & Plan:   Problem List Items Addressed This Visit       Cardiovascular and Mediastinum   Periodic headache syndrome, not intractable    Refer to neurology        Respiratory   Asthma - Primary    Stable at the present time        Other   Morbid obesity (HCC) 250 lbs; BMI=39.2    - I encouraged the patient to lose weight.  - I educated them on making healthy dietary choices including eating more fruits and vegetables and less fried foods. - I encouraged the patient to exercise more, and educated on the benefits of exercise including weight loss, diabetes prevention, and hypertension prevention.   Dietary counseling with a registered dietician  Referral to a weight management support group (e.g. Weight Watchers, Overeaters Anonymous)  If your BMI is greater than 29 or you have gained more than 15 pounds you should work on weight loss.  Attend a healthy cooking class       Environmental and seasonal allergies    Take Claritin 5 mg p.o. daily as needed       Outpatient Encounter Medications as of 01/03/2021  Medication Sig   cephALEXin (KEFLEX) 500 MG capsule Take 1 capsule (500 mg total) by mouth 3 (three) times daily. (Patient not taking: Reported on 07/23/2019)   HYDROcodone-acetaminophen  (NORCO/VICODIN) 5-325 MG tablet Take 1 tablet by mouth every 8 (eight) hours as needed for moderate pain. (Patient not taking: Reported on 07/23/2019)   ibuprofen (ADVIL) 200 MG tablet Take 200 mg by mouth every 6 (six) hours as needed.   Norgestimate-Ethinyl Estradiol Triphasic (TRI-LO-SPRINTEC) 0.18/0.215/0.25 MG-25 MCG tab Take 1 tablet by mouth daily.   sulfamethoxazole-trimethoprim (BACTRIM DS) 800-160 MG tablet Take 1 tablet by mouth 2 (two) times daily. (Patient not taking: Reported on  07/23/2019)   No facility-administered encounter medications on file as of 01/03/2021.    Follow-up: No follow-ups on file.   Corky Downs, MD

## 2021-01-03 NOTE — Assessment & Plan Note (Signed)
Refer to neurology

## 2021-01-03 NOTE — Assessment & Plan Note (Signed)

## 2021-01-03 NOTE — Assessment & Plan Note (Signed)
Take Claritin 5 mg p.o. daily as needed 

## 2021-01-06 DIAGNOSIS — Z79899 Other long term (current) drug therapy: Secondary | ICD-10-CM | POA: Diagnosis not present

## 2021-01-06 DIAGNOSIS — E038 Other specified hypothyroidism: Secondary | ICD-10-CM | POA: Diagnosis not present

## 2021-01-06 DIAGNOSIS — E559 Vitamin D deficiency, unspecified: Secondary | ICD-10-CM | POA: Diagnosis not present

## 2021-01-06 DIAGNOSIS — E538 Deficiency of other specified B group vitamins: Secondary | ICD-10-CM | POA: Diagnosis not present

## 2021-01-06 DIAGNOSIS — G43719 Chronic migraine without aura, intractable, without status migrainosus: Secondary | ICD-10-CM | POA: Diagnosis not present

## 2021-01-13 ENCOUNTER — Ambulatory Visit: Payer: Medicaid Other | Admitting: Nurse Practitioner

## 2021-01-31 ENCOUNTER — Encounter: Payer: Self-pay | Admitting: Internal Medicine

## 2021-01-31 ENCOUNTER — Other Ambulatory Visit: Payer: Self-pay

## 2021-01-31 ENCOUNTER — Ambulatory Visit (INDEPENDENT_AMBULATORY_CARE_PROVIDER_SITE_OTHER): Payer: BC Managed Care – PPO | Admitting: Internal Medicine

## 2021-01-31 VITALS — BP 143/89 | HR 75 | Ht 66.0 in | Wt 245.9 lb

## 2021-01-31 DIAGNOSIS — J3089 Other allergic rhinitis: Secondary | ICD-10-CM

## 2021-01-31 DIAGNOSIS — J45909 Unspecified asthma, uncomplicated: Secondary | ICD-10-CM | POA: Diagnosis not present

## 2021-01-31 DIAGNOSIS — G43C0 Periodic headache syndromes in child or adult, not intractable: Secondary | ICD-10-CM

## 2021-01-31 MED ORDER — ALBUTEROL SULFATE HFA 108 (90 BASE) MCG/ACT IN AERS: 2.0000 | INHALATION_SPRAY | Freq: Four times a day (QID) | RESPIRATORY_TRACT | 0 refills | Status: AC | PRN

## 2021-01-31 MED ORDER — FLUTICASONE-SALMETEROL 100-50 MCG/ACT IN AEPB: 1.0000 | INHALATION_SPRAY | Freq: Two times a day (BID) | RESPIRATORY_TRACT | 3 refills | Status: AC

## 2021-01-31 NOTE — Progress Notes (Signed)
Established Patient Office Visit  Subjective:  Patient ID: Courtney Galvan, female    DOB: 07-13-1995  Age: 25 y.o. MRN: [id number]  CC:  Chief Complaint  Patient presents with   Asthma    Patient complains of asthma flare up.    Asthma Her past medical history is significant for asthma.   Courtney Galvan presents for asthama flare up  Past Medical History:  Diagnosis Date   Asthma    Environmental and seasonal allergies    Migraine     Past Surgical History:  Procedure Laterality Date   NECK MASS EXCISION     Removal of mass 2007    Family History  Problem Relation Age of Onset   Hypertension Maternal Grandmother    Diabetes Maternal Grandmother    Brain cancer Maternal Grandmother    Throat cancer Maternal Grandfather    Migraines Maternal Aunt     Social History   Socioeconomic History   Marital status: Single    Spouse name: Not on file   Number of children: Not on file   Years of education: Not on file   Highest education level: Not on file  Occupational History   Not on file  Tobacco Use   Smoking status: Never   Smokeless tobacco: Never  Substance and Sexual Activity   Alcohol use: Not Currently    Alcohol/week: 1.0 standard drink    Types: 1 Shots of liquor per week    Comment: qo month   Drug use: Not Currently    Types: Marijuana    Comment: last use 11/2019   Sexual activity: Yes    Partners: Male    Birth control/protection: Condom  Other Topics Concern   Not on file  Social History Narrative   Not on file   Social Determinants of Health   Financial Resource Strain: Not on file  Food Insecurity: Not on file  Transportation Needs: Not on file  Physical Activity: Not on file  Stress: Not on file  Social Connections: Not on file  Intimate Partner Violence: Not on file     Current Outpatient Medications:    acetaZOLAMIDE (DIAMOX) 250 MG tablet, Take 500 mg by mouth 2 (two) times daily., Disp: , Rfl:    albuterol  (VENTOLIN HFA) 108 (90 Base) MCG/ACT inhaler, Inhale 2 puffs into the lungs every 6 (six) hours as needed for wheezing or shortness of breath., Disp: 8 g, Rfl: 0   fluticasone-salmeterol (ADVAIR) 100-50 MCG/ACT AEPB, Inhale 1 puff into the lungs 2 (two) times daily., Disp: 1 each, Rfl: 3   SUMAtriptan (IMITREX) 50 MG tablet, Take 50 mg by mouth daily as needed., Disp: , Rfl:    Vitamin D, Ergocalciferol, (DRISDOL) 1.25 MG (50000 UNIT) CAPS capsule, Take 50,000 Units by mouth once a week., Disp: , Rfl:    No Known Allergies  ROS Review of Systems  Constitutional: Negative.   HENT: Negative.    Eyes: Negative.   Respiratory: Negative.    Cardiovascular: Negative.   Gastrointestinal: Negative.   Endocrine: Negative.   Genitourinary: Negative.   Musculoskeletal: Negative.   Skin: Negative.   Allergic/Immunologic: Negative.   Neurological: Negative.   Hematological: Negative.   Psychiatric/Behavioral: Negative.    All other systems reviewed and are negative.    Objective:    Physical Exam Vitals reviewed.  Constitutional:      Appearance: Normal appearance.  HENT:     Mouth/Throat:     Mouth: Mucous membranes are moist.  Eyes:     Pupils: Pupils are equal, round, and reactive to light.  Neck:     Vascular: No carotid bruit.  Cardiovascular:     Rate and Rhythm: Normal rate and regular rhythm.     Pulses: Normal pulses.     Heart sounds: Normal heart sounds.  Pulmonary:     Effort: Pulmonary effort is normal.     Breath sounds: Normal breath sounds.  Abdominal:     General: Bowel sounds are normal.     Palpations: Abdomen is soft. There is no hepatomegaly, splenomegaly or mass.     Tenderness: There is no abdominal tenderness.     Hernia: No hernia is present.  Musculoskeletal:        General: No tenderness.     Cervical back: Neck supple.     Right lower leg: No edema.     Left lower leg: No edema.  Skin:    Findings: No rash.  Neurological:     Mental Status:  She is alert and oriented to person, place, and time.     Motor: No weakness.  Psychiatric:        Mood and Affect: Mood and affect normal.        Behavior: Behavior normal.    BP (!) 143/89   Pulse 75   Ht 5\' 6"  (1.676 m)   Wt 245 lb 14.4 oz (111.5 kg)   SpO2 98%   BMI 39.69 kg/m  Wt Readings from Last 3 Encounters:  01/31/21 245 lb 14.4 oz (111.5 kg)  01/03/21 247 lb 9.6 oz (112.3 kg)  01/25/20 243 lb (110.2 kg)     Health Maintenance Due  Topic Date Due   HPV VACCINES (1 - 2-dose series) Never done   HIV Screening  Never done   Hepatitis C Screening  Never done   TETANUS/TDAP  Never done       Topic Date Due   HPV VACCINES (1 - 2-dose series) Never done    No results found for: TSH Lab Results  Component Value Date   WBC 7.1 10/20/2019   HGB 11.6 (L) 10/20/2019   HCT 35.2 (L) 10/20/2019   MCV 92.6 10/20/2019   PLT 301 10/20/2019   Lab Results  Component Value Date   NA 137 10/20/2019   K 3.9 10/20/2019   CO2 27 10/20/2019   GLUCOSE 84 10/20/2019   BUN 9 10/20/2019   CREATININE 0.59 10/20/2019   CALCIUM 9.2 10/20/2019   ANIONGAP 10 10/20/2019   No results found for: CHOL No results found for: HDL No results found for: LDLCALC No results found for: TRIG No results found for: CHOLHDL No results found for: 10/22/2019    Assessment & Plan:   Problem List Items Addressed This Visit       Cardiovascular and Mediastinum   Periodic headache syndrome, not intractable - Primary    Patient is being followed by neurologist      Relevant Medications   acetaZOLAMIDE (DIAMOX) 250 MG tablet   SUMAtriptan (IMITREX) 50 MG tablet     Respiratory   Asthma    Patient was advised care on asthma      Relevant Medications   albuterol (VENTOLIN HFA) 108 (90 Base) MCG/ACT inhaler   fluticasone-salmeterol (ADVAIR) 100-50 MCG/ACT AEPB     Other   Morbid obesity (HCC) 250 lbs; BMI=39.2    - I encouraged the patient to lose weight.  - I educated them on making  healthy dietary  choices including eating more fruits and vegetables and less fried foods. - I encouraged the patient to exercise more, and educated on the benefits of exercise including weight loss, diabetes prevention, and hypertension prevention.   Dietary counseling with a registered dietician  Referral to a weight management support group (e.g. Weight Watchers, Overeaters Anonymous)  If your BMI is greater than 29 or you have gained more than 15 pounds you should work on weight loss.  Attend a healthy cooking class       Environmental and seasonal allergies    Patient was told about the ragweed at the present time she was advised to take Claritin 5 mg as needed       Meds ordered this encounter  Medications   albuterol (VENTOLIN HFA) 108 (90 Base) MCG/ACT inhaler    Sig: Inhale 2 puffs into the lungs every 6 (six) hours as needed for wheezing or shortness of breath.    Dispense:  8 g    Refill:  0   fluticasone-salmeterol (ADVAIR) 100-50 MCG/ACT AEPB    Sig: Inhale 1 puff into the lungs 2 (two) times daily.    Dispense:  1 each    Refill:  3    Follow-up: No follow-ups on file.    Corky Downs, MD

## 2021-01-31 NOTE — Assessment & Plan Note (Signed)
Patient is being followed by neurologist

## 2021-01-31 NOTE — Assessment & Plan Note (Signed)
Patient was advised care on asthma

## 2021-01-31 NOTE — Assessment & Plan Note (Signed)

## 2021-01-31 NOTE — Assessment & Plan Note (Signed)
Patient was told about the ragweed at the present time she was advised to take Claritin 5 mg as needed

## 2021-02-02 ENCOUNTER — Other Ambulatory Visit: Payer: Self-pay

## 2021-02-02 ENCOUNTER — Emergency Department: Payer: BC Managed Care – PPO

## 2021-02-02 ENCOUNTER — Emergency Department
Admission: EM | Admit: 2021-02-02 | Discharge: 2021-02-02 | Disposition: A | Discharge status: Home / Self Care | Payer: BC Managed Care – PPO | Attending: Emergency Medicine | Admitting: Emergency Medicine

## 2021-02-02 DIAGNOSIS — Z8669 Personal history of other diseases of the nervous system and sense organs: Secondary | ICD-10-CM | POA: Diagnosis not present

## 2021-02-02 DIAGNOSIS — J45909 Unspecified asthma, uncomplicated: Secondary | ICD-10-CM | POA: Diagnosis not present

## 2021-02-02 DIAGNOSIS — Z7951 Long term (current) use of inhaled steroids: Secondary | ICD-10-CM | POA: Diagnosis not present

## 2021-02-02 DIAGNOSIS — R42 Dizziness and giddiness: Secondary | ICD-10-CM | POA: Diagnosis not present

## 2021-02-02 DIAGNOSIS — R519 Headache, unspecified: Secondary | ICD-10-CM | POA: Insufficient documentation

## 2021-02-02 LAB — CBC
HCT: 41.1 % (ref 36.0–46.0)
Hemoglobin: 13.9 g/dL (ref 12.0–15.0)
MCH: 30.9 pg (ref 26.0–34.0)
MCHC: 33.8 g/dL (ref 30.0–36.0)
MCV: 91.3 fL (ref 80.0–100.0)
Platelets: 368 10*3/uL (ref 150–400)
RBC: 4.5 MIL/uL (ref 3.87–5.11)
RDW: 12.5 % (ref 11.5–15.5)
WBC: 6.6 10*3/uL (ref 4.0–10.5)
nRBC: 0 % (ref 0.0–0.2)

## 2021-02-02 LAB — COMPREHENSIVE METABOLIC PANEL
ALT: 19 U/L (ref 0–44)
AST: 18 U/L (ref 15–41)
Albumin: 4.3 g/dL (ref 3.5–5.0)
Alkaline Phosphatase: 54 U/L (ref 38–126)
Anion gap: 8 (ref 5–15)
BUN: 10 mg/dL (ref 6–20)
CO2: 21 mmol/L — ABNORMAL LOW (ref 22–32)
Calcium: 9.4 mg/dL (ref 8.9–10.3)
Chloride: 108 mmol/L (ref 98–111)
Creatinine, Ser: 0.61 mg/dL (ref 0.44–1.00)
GFR, Estimated: >60 mL/min (ref >60)
Glucose, Bld: 78 mg/dL (ref 70–99)
Potassium: 3.6 mmol/L (ref 3.5–5.1)
Sodium: 137 mmol/L (ref 135–145)
Total Bilirubin: 0.7 mg/dL (ref 0.3–1.2)
Total Protein: 8.1 g/dL (ref 6.5–8.1)

## 2021-02-02 LAB — POC URINE PREG, ED: Preg Test, Ur: NEGATIVE

## 2021-02-02 MED ORDER — IOHEXOL 350 MG/ML SOLN
75.0000 mL | Freq: Once | INTRAVENOUS | Status: AC | PRN
  Administered 2021-02-02: 75 mL via INTRAVENOUS

## 2021-02-02 MED ORDER — SODIUM CHLORIDE 0.9 % IV BOLUS
1000.0000 mL | Freq: Once | INTRAVENOUS | Status: AC
  Administered 2021-02-02: 1000 mL via INTRAVENOUS

## 2021-02-02 NOTE — ED Triage Notes (Signed)
First Nurse Note:  C/O left forehead headache x 1 day.  Has history of increased fluid in brain, takes Acetazolabmide 500 mg daily for pressure build up and and succinate 50 mg for pain --max of two doses in 24 hours.  AAOx3.  Skin warm and dry.  NAD.  MAE equally and strong.  Gait steady.

## 2021-02-02 NOTE — ED Triage Notes (Signed)
Pt c/o having headache today and pressure behind the left eye, "felt like something behind my eye popped" this morning,. Denies N/V or visual changes, states she has a hx of intracranial hypertension who she see Dr Sherryll Burger for. Pt is in NAD, ambulatory with a steady gait.

## 2021-02-02 NOTE — Discharge Instructions (Signed)
Your lab tests and CT scan of the head were all okay today.  Please continue your medications as recommended by Dr. Sherryll Burger and follow-up with your future testing plan.  Be sure to drink plenty of fluids to stay well-hydrated.

## 2021-02-02 NOTE — ED Provider Notes (Signed)
[facility] Emergency Department Provider Note  ____________________________________________  Time seen: Approximately 2:27 PM  I have reviewed the triage vital signs and the nursing notes.   HISTORY  Chief Complaint Headache    HPI Courtney Galvan is a 25 y.o. female with a history of asthma, migraines, currently being evaluated by neurology for intracranial hypertension who comes ED complaining of left forehead headache that started yesterday, abrupt in onset.  Not severe or thunderclap and currently it is improved but she feels like it is worse when she is standing up and walking.  No vision changes, no paresthesias or weakness.  No vomiting or fever or neck pain.  Feels different from her usual migraine headaches.  She has an outpatient neurology plan for MRI/MRV, LP.  She has had some labs done but is still waiting for the other testing to be completed.  She is taking acetazolamide as prescribed    Past Medical History:  Diagnosis Date   Asthma    Environmental and seasonal allergies    Migraine      Patient Active Problem List   Diagnosis Date Noted   Environmental and seasonal allergies 01/03/2021   Periodic headache syndrome, not intractable 01/03/2021   Asthma 01/25/2020   Morbid obesity (HCC) 250 lbs; BMI=39.2 12/21/2019     Past Surgical History:  Procedure Laterality Date   NECK MASS EXCISION     Removal of mass 2007     Prior to Admission medications   Medication Sig Start Date End Date Taking? Authorizing Provider  acetaZOLAMIDE (DIAMOX) 250 MG tablet Take 500 mg by mouth 2 (two) times daily. 01/16/21 01/16/22  [provider]  albuterol (VENTOLIN HFA) 108 (90 Base) MCG/ACT inhaler Inhale 2 puffs into the lungs every 6 (six) hours as needed for wheezing or shortness of breath. 01/31/21   Corky Downs, MD  fluticasone-salmeterol (ADVAIR) 100-50 MCG/ACT AEPB Inhale 1 puff into the lungs 2 (two) times daily. 01/31/21    Corky Downs, MD  SUMAtriptan (IMITREX) 50 MG tablet Take 50 mg by mouth daily as needed.    [provider]  Vitamin D, Ergocalciferol, (DRISDOL) 1.25 MG (50000 UNIT) CAPS capsule Take 50,000 Units by mouth once a week. 01/23/21   [provider]     Allergies Patient has no known allergies.   Family History  Problem Relation Age of Onset   Hypertension Maternal Grandmother    Diabetes Maternal Grandmother    Brain cancer Maternal Grandmother    Throat cancer Maternal Grandfather    Migraines Maternal Aunt     Social History Social History   Tobacco Use   Smoking status: Never   Smokeless tobacco: Never  Substance Use Topics   Alcohol use: Not Currently    Alcohol/week: 1.0 standard drink    Types: 1 Shots of liquor per week    Comment: qo month   Drug use: Not Currently    Types: Marijuana    Comment: last use 11/2019    Review of Systems  Constitutional:   No fever or chills.  ENT:   No sore throat. No rhinorrhea. Cardiovascular:   No chest pain or syncope. Respiratory:   No dyspnea or cough. Gastrointestinal:   Negative for abdominal pain, vomiting and diarrhea.  Musculoskeletal:   Negative for focal pain or swelling All other systems reviewed and are negative except as documented above in ROS and HPI.  ____________________________________________   PHYSICAL EXAM:  VITAL SIGNS: ED Triage Vitals  Enc Vitals  Group     BP 02/02/21 0933 116/78     Pulse Rate 02/02/21 0933 70     Resp 02/02/21 0933 18     Temp 02/02/21 0935 98 F (36.7 C)     Temp Source 02/02/21 0935 Oral     SpO2 02/02/21 0933 97 %     Weight 02/02/21 0935 250 lb (113.4 kg)     Height 02/02/21 0935 5\' 7"  (1.702 m)     Head Circumference --      Peak Flow --      Pain Score 02/02/21 0935 9     Pain Loc --      Pain Edu? --      Excl. in GC? --     Vital signs reviewed, nursing assessments reviewed.   Constitutional:   Alert and oriented. Non-toxic  appearance. Eyes:   Conjunctivae are normal. EOMI and painless. PERRL.  Funduscopy shows mild bilateral papilledema, no pallor, no hemorrhages. ENT      Head:   Normocephalic and atraumatic.      Nose:   Normal.      Mouth/Throat:   Normal, moist mucosa      Neck:   No meningismus. Full ROM.  Thyroid nonpalpable.  No neck tenderness Hematological/Lymphatic/Immunilogical:   No cervical lymphadenopathy. Cardiovascular:   RRR.  Respiratory:   Normal respiratory effort without tachypnea/retractions. Musculoskeletal:   Normal range of motion in all extremities. No joint effusions.  No lower extremity tenderness.  No edema. Neurologic:   Normal speech and language.  Motor grossly intact. No acute focal neurologic deficits are appreciated.  Skin:    Skin is warm, dry and intact. No rash noted.  No petechiae, purpura, or bullae.  ____________________________________________    LABS (pertinent positives/negatives) (all labs ordered are listed, but only abnormal results are displayed) Labs Reviewed  COMPREHENSIVE METABOLIC PANEL - Abnormal; Notable for the following components:      Result Value   CO2 21 (*)    All other components within normal limits  POC URINE PREG, ED - Normal  CBC   ____________________________________________   EKG    ____________________________________________    RADIOLOGY  CT Angio Head W or Wo Contrast  Result Date: 02/02/2021 CLINICAL DATA:  Dizziness, sudden pop with headache over left eye since yesterday EXAM: CT ANGIOGRAPHY HEAD TECHNIQUE: Multidetector CT imaging of the head was performed using the standard protocol during bolus administration of intravenous contrast. Multiplanar CT image reconstructions and MIPs were obtained to evaluate the vascular anatomy. CONTRAST:  68mL OMNIPAQUE IOHEXOL 350 MG/ML SOLN COMPARISON:  None. FINDINGS: CT HEAD Brain: There is no evidence of acute intracranial hemorrhage, extra-axial fluid collection, or acute infarct.  The ventricles are normal in size. There is no mass lesion. There is no midline shift. Vascular: See below Skull: Normal. Negative for fracture or focal lesion. Sinuses: The imaged paranasal sinuses are clear. The globes and orbits are unremarkable. Other: None. CTA HEAD Anterior circulation: The intracranial ICAs are patent. The bilateral MCAs and ACAs are patent. There is no aneurysm. Posterior circulation: The bilateral V4 segments are patent. The basilar artery is patent. The bilateral PCAs are patent. There is a fetal PCA on the right. Venous sinuses: As permitted by contrast timing, patent. Anatomic variants: Fetal PCA on the right. Review of the MIP images confirms the above findings. IMPRESSION: 1. No acute intracranial pathology. 2. Normal intracranial vasculature; no aneurysm. Electronically Signed   By: 73m M.D.   On: 02/02/2021  14:18    ____________________________________________   PROCEDURES Procedures  ____________________________________________  DIFFERENTIAL DIAGNOSIS   Intracranial hemorrhage, functional headache, migraine, dehydration  CLINICAL IMPRESSION / ASSESSMENT AND PLAN / ED COURSE  Medications ordered in the ED: Medications  sodium chloride 0.9 % bolus 1,000 mL (0 mLs Intravenous Stopped 02/02/21 1400)  iohexol (OMNIPAQUE) 350 MG/ML injection 75 mL (75 mLs Intravenous Contrast Given 02/02/21 1337)    Pertinent labs & imaging results that were available during my care of the patient were reviewed by me and considered in my medical decision making (see chart for details).  DANAYSHA KIRN was evaluated in Emergency Department on 02/02/2021 for the symptoms described in the history of present illness. She was evaluated in the context of the global COVID-19 pandemic, which necessitated consideration that the patient might be at risk for infection with the SARS-CoV-2 virus that causes COVID-19. Institutional protocols and algorithms that pertain to the  evaluation of patients at risk for COVID-19 are in a state of rapid change based on information released by regulatory bodies including the CDC and federal and state organizations. These policies and algorithms were followed during the patient's care in the ED.   Pt p/w atypical headache which is focal at the left forehead, currently resolved.  She has no neurodeficits or concerning exam findings.  I do not think she needs lumbar puncture today, no evidence of meningitis encephalitis or occult bleed.  CT angiogram is unremarkable and does not show any signs of any aneurysm.  Vital signs are normal, she is calm and comfortable and nontoxic and stable for discharge home to continue outpatient neurology plan.   ____________________________________________   FINAL CLINICAL IMPRESSION(S) / ED DIAGNOSES    Final diagnoses:  Headache disorder     ED Discharge Orders     None       Portions of this note were generated with dragon dictation software. Dictation errors may occur despite best attempts at proofreading.    Sharman Cheek, MD 02/02/21 1432

## 2021-02-21 DIAGNOSIS — R0681 Apnea, not elsewhere classified: Secondary | ICD-10-CM | POA: Diagnosis not present

## 2021-02-21 DIAGNOSIS — Z79899 Other long term (current) drug therapy: Secondary | ICD-10-CM | POA: Diagnosis not present

## 2021-02-21 DIAGNOSIS — R0683 Snoring: Secondary | ICD-10-CM | POA: Diagnosis not present

## 2021-03-06 ENCOUNTER — Ambulatory Visit: Payer: BC Managed Care – PPO | Admitting: Internal Medicine

## 2021-03-13 DIAGNOSIS — E559 Vitamin D deficiency, unspecified: Secondary | ICD-10-CM | POA: Diagnosis not present

## 2021-03-13 DIAGNOSIS — R0683 Snoring: Secondary | ICD-10-CM | POA: Diagnosis not present

## 2021-03-13 DIAGNOSIS — R519 Headache, unspecified: Secondary | ICD-10-CM | POA: Diagnosis not present

## 2021-03-27 ENCOUNTER — Other Ambulatory Visit: Admission: RE | Admit: 2021-03-27 | Payer: BC Managed Care – PPO | Source: Ambulatory Visit

## 2021-03-28 ENCOUNTER — Other Ambulatory Visit: Payer: Self-pay | Admitting: Neurology

## 2021-03-28 DIAGNOSIS — G43719 Chronic migraine without aura, intractable, without status migrainosus: Secondary | ICD-10-CM

## 2021-03-29 ENCOUNTER — Ambulatory Visit: Payer: BC Managed Care – PPO | Attending: Neurology

## 2021-03-29 DIAGNOSIS — R0683 Snoring: Secondary | ICD-10-CM | POA: Diagnosis not present

## 2021-04-05 ENCOUNTER — Other Ambulatory Visit: Payer: Self-pay

## 2021-04-07 DIAGNOSIS — G4730 Sleep apnea, unspecified: Secondary | ICD-10-CM | POA: Diagnosis not present

## 2021-04-07 DIAGNOSIS — R0683 Snoring: Secondary | ICD-10-CM | POA: Diagnosis not present

## 2021-04-12 ENCOUNTER — Ambulatory Visit
Admission: RE | Admit: 2021-04-12 | Discharge: 2021-04-12 | Disposition: A | Discharge status: Home / Self Care | Payer: BC Managed Care – PPO | Source: Ambulatory Visit | Attending: Neurology | Admitting: Neurology

## 2021-04-12 ENCOUNTER — Other Ambulatory Visit: Payer: Self-pay

## 2021-04-12 DIAGNOSIS — G43719 Chronic migraine without aura, intractable, without status migrainosus: Secondary | ICD-10-CM | POA: Diagnosis not present

## 2021-04-12 MED ORDER — GADOBUTROL 1 MMOL/ML IV SOLN
10.0000 mL | Freq: Once | INTRAVENOUS | Status: AC | PRN
  Administered 2021-04-12: 10 mL via INTRAVENOUS

## 2021-04-17 DIAGNOSIS — R0683 Snoring: Secondary | ICD-10-CM | POA: Diagnosis not present

## 2021-04-17 DIAGNOSIS — R519 Headache, unspecified: Secondary | ICD-10-CM | POA: Diagnosis not present

## 2021-04-17 DIAGNOSIS — E559 Vitamin D deficiency, unspecified: Secondary | ICD-10-CM | POA: Diagnosis not present

## 2021-04-17 DIAGNOSIS — G43719 Chronic migraine without aura, intractable, without status migrainosus: Secondary | ICD-10-CM | POA: Diagnosis not present

## 2021-04-20 ENCOUNTER — Telehealth: Payer: BC Managed Care – PPO

## 2021-06-23 DIAGNOSIS — L300 Nummular dermatitis: Secondary | ICD-10-CM | POA: Diagnosis not present

## 2021-09-21 DIAGNOSIS — G43719 Chronic migraine without aura, intractable, without status migrainosus: Secondary | ICD-10-CM | POA: Diagnosis not present

## 2021-09-21 DIAGNOSIS — E569 Vitamin deficiency, unspecified: Secondary | ICD-10-CM | POA: Diagnosis not present

## 2021-09-21 DIAGNOSIS — E559 Vitamin D deficiency, unspecified: Secondary | ICD-10-CM | POA: Diagnosis not present

## 2021-09-21 DIAGNOSIS — R519 Headache, unspecified: Secondary | ICD-10-CM | POA: Diagnosis not present

## 2022-01-24 DIAGNOSIS — H527 Unspecified disorder of refraction: Secondary | ICD-10-CM | POA: Diagnosis not present

## 2022-01-24 DIAGNOSIS — G932 Benign intracranial hypertension: Secondary | ICD-10-CM | POA: Diagnosis not present

## 2022-03-19 DIAGNOSIS — J358 Other chronic diseases of tonsils and adenoids: Secondary | ICD-10-CM | POA: Diagnosis not present

## 2022-03-19 DIAGNOSIS — J039 Acute tonsillitis, unspecified: Secondary | ICD-10-CM | POA: Diagnosis not present

## 2022-04-03 DIAGNOSIS — E559 Vitamin D deficiency, unspecified: Secondary | ICD-10-CM | POA: Diagnosis not present

## 2022-04-03 DIAGNOSIS — R0683 Snoring: Secondary | ICD-10-CM | POA: Diagnosis not present

## 2022-04-03 DIAGNOSIS — G43719 Chronic migraine without aura, intractable, without status migrainosus: Secondary | ICD-10-CM | POA: Diagnosis not present

## 2022-05-23 IMAGING — MR MR HEAD WO/W CM
14 series · 48 of 48 positions shown · IV contrast (gadavist)
Comparison: Head 02/02/2021. CT angiogram

CLINICAL DATA: Provided history: Intractable chronic migraine
without Olad and without status migrainosus. Additional history
provided: Multifactorial headaches, migraine without Olad in patient
with right greater than left papilledema, likely from idiopathic
intracranial hypertension in patient with history of episodic
migraine and now with chronic migraine features. Patient reports
symptoms for 2 years.

EXAM:
MRI HEAD WITHOUT AND WITH CONTRAST
MR VENOGRAM HEAD WITHOUT AND WITH CONTRAST
TECHNIQUE: Multiplanar, multi-echo pulse sequences of the brain and surrounding
structures were acquired without and with intravenous contrast.
Angiographic images of the intracranial venous structures were
acquired using MRV technique without and with intravenous contrast.
CONTRAST:  10mL GADAVIST GADOBUTROL 1 MMOL/ML IV SOLN

[Series 5: ax dwi_tracew · axial · 3.0mm · 0.65mm/px · z∈[-76,+77]mm · 3 of 48 slices shown]
[im 1/48]
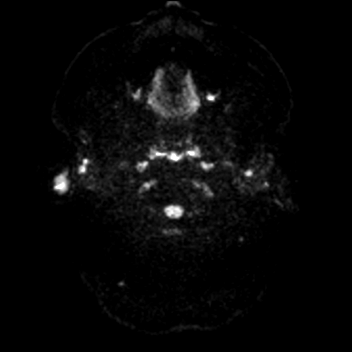
[im 24/48]
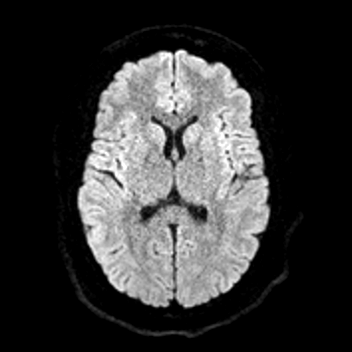
[im 48/48]
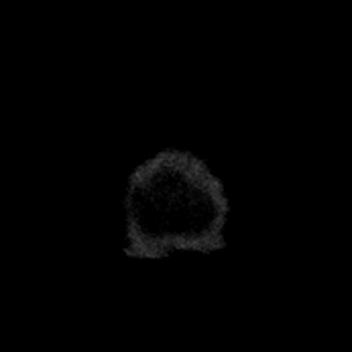

[Series 6: ax dwi_adc · axial · 3.0mm · 0.65mm/px · z∈[-76,+77]mm · 3 of 48 slices shown]
[im 1/48]
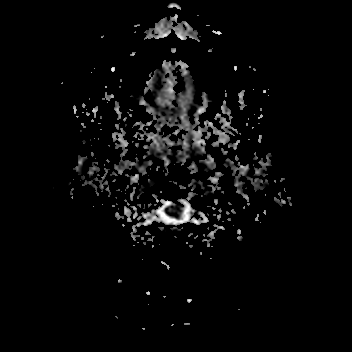
[im 24/48]
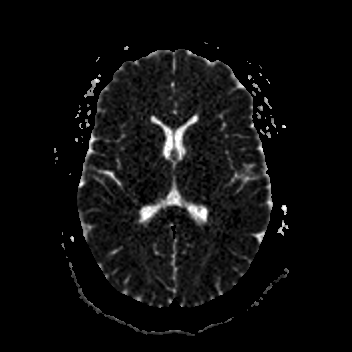
[im 48/48]
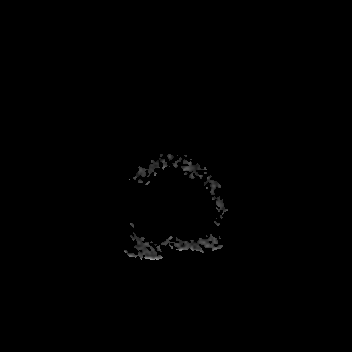

[Series 7: cor dwi_tracew · coronal · 5.0mm · 1.80mm/px · 2 of 40 slices shown]
[im 1/40]
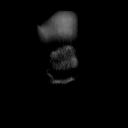
[im 40/40]
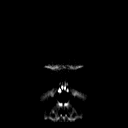

[Series 8: cor dwi_adc · coronal · 5.0mm · 1.80mm/px · 2 of 40 slices shown]
[im 1/40]
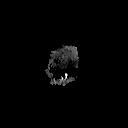
[im 40/40]
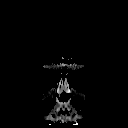

[Series 9: T1 · sagittal · 5.0mm · 0.62mm/px · 1 of 25 slices shown (1 of 2)]
[im 1/25]
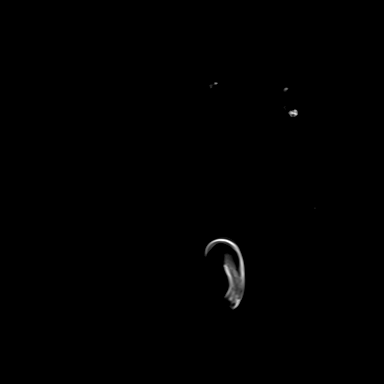

[Series 10: T2 · axial · 5.0mm · 0.53mm/px · 1 of 26 slices shown]
[im 1/26]
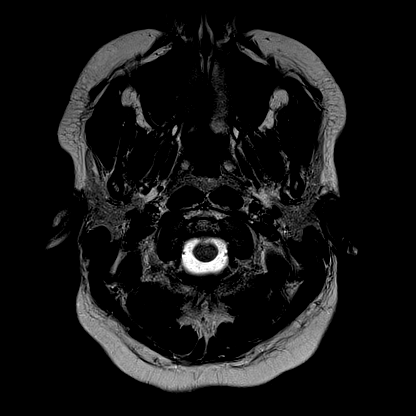

[Series 12: pha_images · axial · 3.0mm · 0.90mm/px · z∈[-86,+88]mm · 3 of 60 slices shown]
[im 1/60]
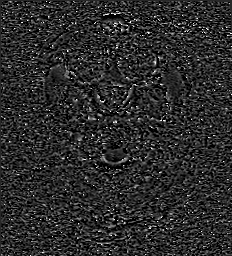
[im 30/60]
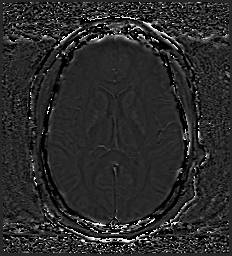
[im 60/60]
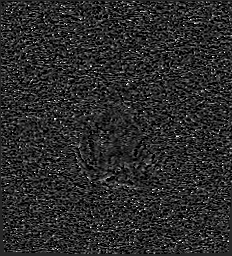

[Series 13: swi_images · axial · 3.0mm · 0.90mm/px · z∈[-86,+88]mm · 3 of 60 slices shown]
[im 1/60]
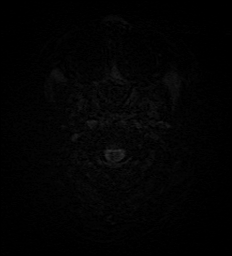
[im 30/60]
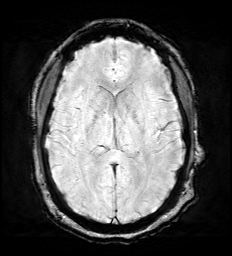
[im 60/60]
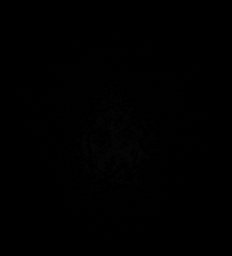

[Series 14: mip_images(sw) · axial · 24.0mm · 0.90mm/px · z∈[-76,+78]mm · 3 of 53 slices shown]
[im 1/53]
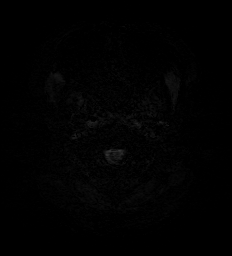
[im 27/53]
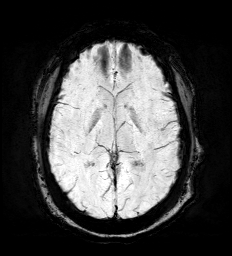
[im 53/53]
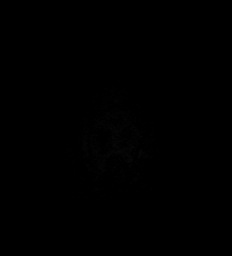

[Series 15: FLAIR · axial · 3.0mm · 0.53mm/px · z∈[-80,+80]mm · 3 of 55 slices shown]
[im 1/55]
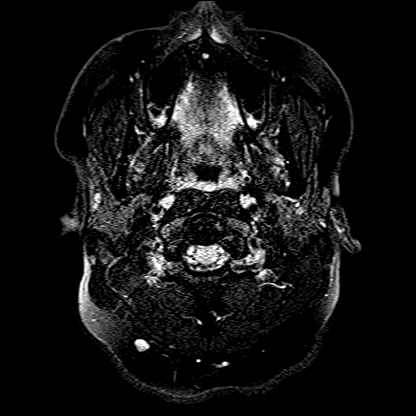
[im 28/55]
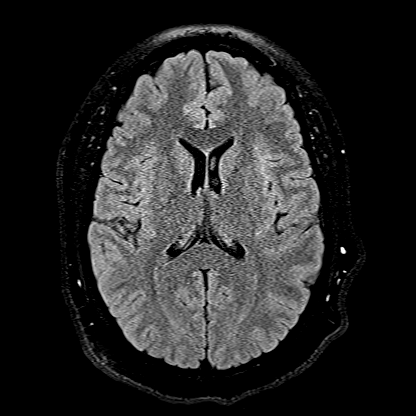
[im 55/55]
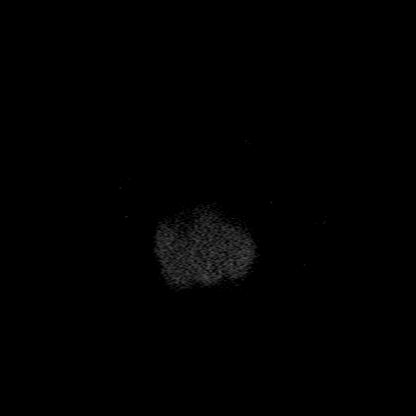

[Series 16: T1 · axial · 1.0mm · 0.98mm/px · z∈[-84,+89]mm · 10 of 176 slices shown (2 of 2)]
[im 1/176]
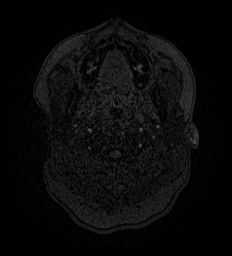
[im 20/176]
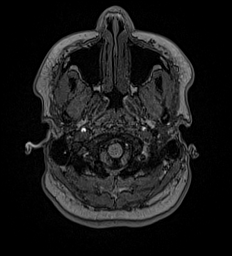
[im 39/176]
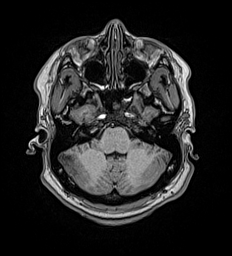
[im 59/176]
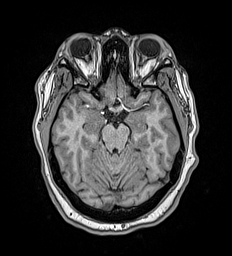
[im 78/176]
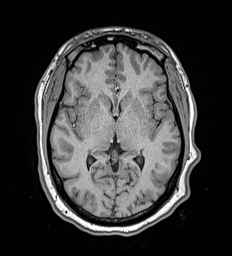
[im 98/176]
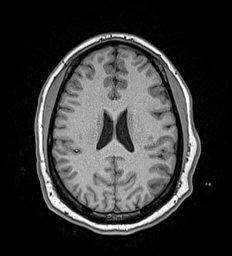
[im 117/176]
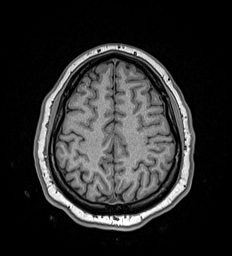
[im 137/176]
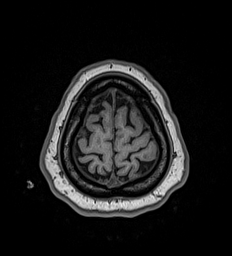
[im 156/176]
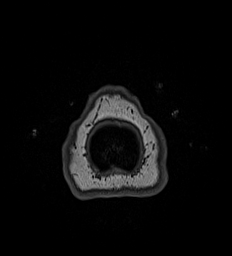
[im 176/176]
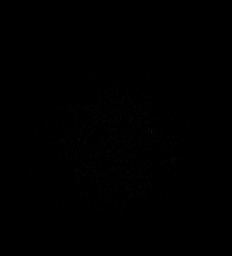

[Series 17: T2 post-contrast · coronal · 5.0mm · 0.57mm/px · 2 of 30 slices shown]
[im 1/30]
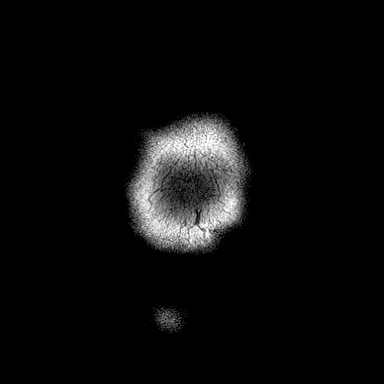
[im 30/30]
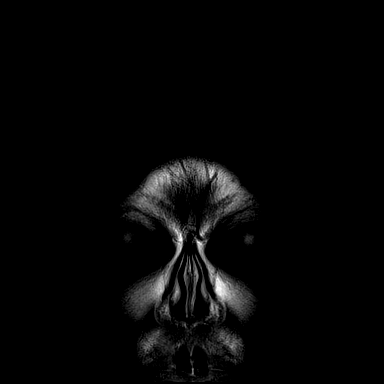

[Series 18: T1 post-contrast · axial · 1.0mm · 0.98mm/px · z∈[-84,+89]mm · 10 of 176 slices shown (1 of 2)]
[im 1/176]
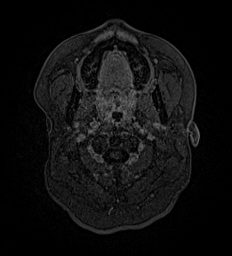
[im 20/176]
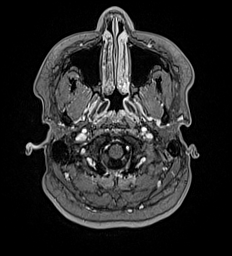
[im 39/176]
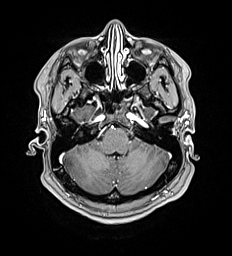
[im 59/176]
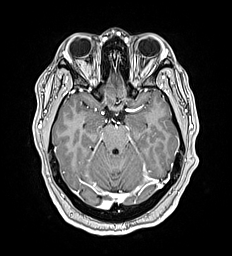
[im 78/176]
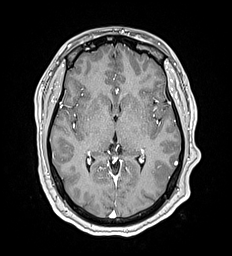
[im 98/176]
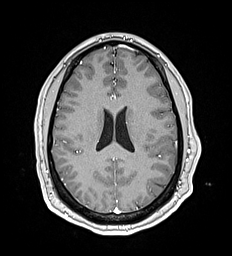
[im 117/176]
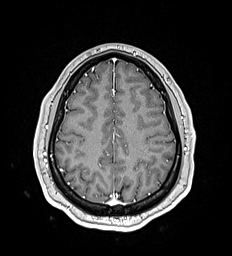
[im 137/176]
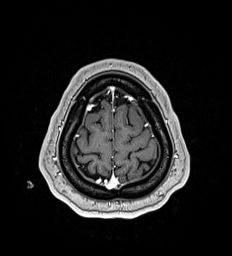
[im 156/176]
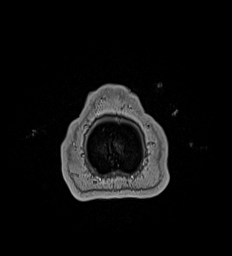
[im 176/176]
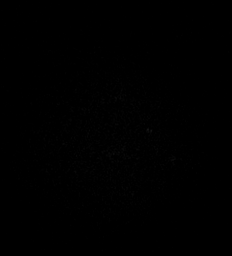

[Series 19: T1 post-contrast · coronal · 5.0mm · 0.57mm/px · 2 of 30 slices shown (2 of 2)]
[im 1/30]
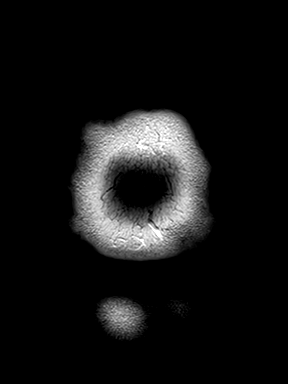
[im 30/30]
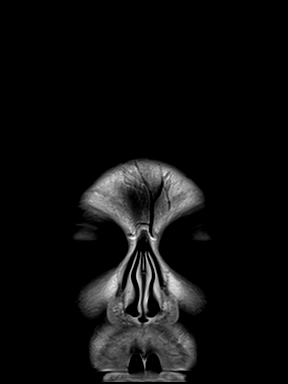

[48 of 48 positions shown; findings below may reference images not displayed]

FINDINGS: MRI HEAD WITHOUT AND WITH CONTRAST

Brain:

Cerebral volume is normal.

Partially empty sella turcica.

No cortical encephalomalacia is identified. No significant cerebral
white matter disease.

There is no acute infarct.

No evidence of an intracranial mass.

No chronic intracranial blood products.

No extra-axial fluid collection.

No midline shift.

No pathologic intracranial enhancement identified.

Vascular: Maintained flow voids within the proximal large arterial
vessels.

Skull and upper cervical spine: No focal suspicious marrow lesion.

Sinuses/Orbits: Visualized orbits show no acute finding. Trace
mucosal thickening within the left frontal ethmoidal recess.

MR VENOGRAM HEAD WITHOUT AND WITH CONTRAST

The superior sagittal sinus, internal cerebral veins, vein of [place],
straight sinus, transverse sinuses, sigmoid sinuses and visualized
jugular veins are patent. There is no appreciable intracranial
venous thrombosis. No definite dural venous sinus stenosis.
IMPRESSION: MRI brain:

1. Partially empty sella turcica. This is a nonspecific finding
which can be associated with idiopathic intracranial hypertension
(pseudotumor cerebri). Alternately, this finding can reflect
incidental anatomic variation.
2. Otherwise unremarkable MRI appearance the brain.

MRV head:

1. No evidence of intracranial venous thrombosis.
2. No definite dural venous sinus stenosis.

## 2022-12-11 ENCOUNTER — Other Ambulatory Visit: Payer: Self-pay | Admitting: Student

## 2022-12-11 DIAGNOSIS — R221 Localized swelling, mass and lump, neck: Secondary | ICD-10-CM

## 2023-01-10 ENCOUNTER — Ambulatory Visit: Payer: BC Managed Care – PPO

## 2023-01-14 ENCOUNTER — Ambulatory Visit: Admission: RE | Admit: 2023-01-14 | Payer: BC Managed Care – PPO | Source: Ambulatory Visit
